# Patient Record
Sex: Male | Born: 1955 | Race: White | Hispanic: No | Smoking: Current every day smoker
Health system: Southern US, Community
[De-identification: ages and names within clinical notes are randomized; demographics above are authoritative.]

## PROBLEM LIST (undated history)

## (undated) DIAGNOSIS — E785 Hyperlipidemia, unspecified: Secondary | ICD-10-CM

## (undated) DIAGNOSIS — I10 Essential (primary) hypertension: Secondary | ICD-10-CM

## (undated) DIAGNOSIS — M1990 Unspecified osteoarthritis, unspecified site: Secondary | ICD-10-CM

## (undated) HISTORY — PX: COLONOSCOPY: SHX174

## (undated) HISTORY — DX: Hyperlipidemia, unspecified: E78.5

## (undated) HISTORY — PX: TONSILLECTOMY: SUR1361

## (undated) HISTORY — DX: Essential (primary) hypertension: I10

## (undated) HISTORY — PX: APPENDECTOMY: SHX54

---

## 2014-01-23 LAB — LIPID PANEL
Cholesterol: 225 — AB (ref 0–200)
HDL: 49 (ref 35–70)
LDL Cholesterol: 145
Triglycerides: 156 (ref 40–160)

## 2014-01-23 LAB — BASIC METABOLIC PANEL
BUN: 15 (ref 4–21)
Creatinine: 1.3 (ref 0.6–1.3)
GLUCOSE: 104
POTASSIUM: 4.8 (ref 3.4–5.3)
SODIUM: 140 (ref 137–147)

## 2014-01-23 LAB — HEPATIC FUNCTION PANEL
ALT: 46 — AB (ref 10–40)
AST: 31 (ref 14–40)
BILIRUBIN, TOTAL: 0.9

## 2014-01-23 LAB — HEMOGLOBIN A1C: HEMOGLOBIN A1C: 5.7

## 2014-06-19 ENCOUNTER — Other Ambulatory Visit: Payer: Self-pay | Admitting: Orthopedic Surgery

## 2014-06-21 ENCOUNTER — Encounter (HOSPITAL_BASED_OUTPATIENT_CLINIC_OR_DEPARTMENT_OTHER): Payer: Self-pay | Admitting: *Deleted

## 2014-06-21 NOTE — Progress Notes (Signed)
No labs needed

## 2014-06-26 NOTE — H&P (Signed)
Patrick Cannon is an 58 y.o. male.    Chief Complaint: Left Second Toe Pain  HPI:  Patient presents with a chief complaint of left second toe mass/cyst.  Patient states that he noticed the cyst former approximately 4 months ago.  He denies any injury.  He denies any fevers chills night sweats or other signs of infection.  Today, he states his pain is mild at most.  He has noticed swelling over the toe but denies any numbness tingling or weakness.  His symptoms do not wake him from sleep.  Nothing seems to make it better or worse.  Patient did attempt to drain the cyst on his own with a sterile needle.  The patient states the cyst came back over the course of several days.  Past Medical History  Diagnosis Date  . Arthritis     Past Surgical History  Procedure Laterality Date  . Colonoscopy    . Appendectomy      age 31  . Tonsillectomy      age 89    History reviewed. No pertinent family history. Social History:  reports that he has been smoking.  He does not have any smokeless tobacco history on file. He reports that he drinks alcohol. He reports that he does not use illicit drugs.  Allergies: No Known Allergies  No prescriptions prior to admission    No results found for this or any previous visit (from the past 48 hour(s)). No results found.  Review of Systems  Constitutional: Negative.   HENT: Negative.   Eyes: Negative.   Respiratory: Negative.   Cardiovascular: Negative.   Gastrointestinal: Negative.   Genitourinary: Negative.   Musculoskeletal: Positive for joint pain.  Skin: Negative.   Neurological: Negative.   Endo/Heme/Allergies: Negative.   Psychiatric/Behavioral: Negative.     Height 5\' 9"  (1.753 m), weight 95.255 kg (210 lb). Physical Exam  Constitutional: He is oriented to person, place, and time. He appears well-developed and well-nourished.  HENT:  Head: Normocephalic and atraumatic.  Eyes: Pupils are equal, round, and reactive to light.  Neck: Normal  range of motion. Neck supple.  Cardiovascular: Normal rate and regular rhythm.   Respiratory: Effort normal.  Musculoskeletal: He exhibits tenderness.  Today, the patient's left foot has good strength good range of motion.  Patient does have an obvious cystic mass over the DIP joint of the second toe.  No erythema or warmth.  Patient is neurovascularly intact distally.  His calves are soft and nontender.  Neurological: He is alert and oriented to person, place, and time.  Skin: Skin is warm and dry.  Psychiatric: He has a normal mood and affect. His behavior is normal. Judgment and thought content normal.     Assessment/Plan Assess: Left second toe mucous cyst  Plan: The patient is discussed with Dr. Mayer Camel who also examined the patient's foot.  Patient wishes to proceed with surgical intervention.  He is made aware the benefits risks and potential complications of surgery.  A posting slip is completed and the patient is to discuss scheduling with Sandi Raveling.  Patient is told that we can do this under straight local.  He is to call with any issues.  We will see the patient in the time of surgical intervention and then approximately 10 days postop for his first postop visit.  Patrick Cannon R 06/26/2014, 8:28 AM

## 2014-06-27 ENCOUNTER — Ambulatory Visit (HOSPITAL_BASED_OUTPATIENT_CLINIC_OR_DEPARTMENT_OTHER)
Admission: RE | Admit: 2014-06-27 | Payer: BC Managed Care – PPO | Source: Ambulatory Visit | Admitting: Orthopedic Surgery

## 2014-06-27 HISTORY — DX: Unspecified osteoarthritis, unspecified site: M19.90

## 2014-06-27 SURGERY — EXCISION MASS
Anesthesia: Choice | Laterality: Left

## 2014-08-01 LAB — BASIC METABOLIC PANEL
BUN: 15 (ref 4–21)
CREATININE: 1.3 (ref 0.6–1.3)
Glucose: 104
Potassium: 4.8 (ref 3.4–5.3)
Sodium: 140 (ref 137–147)

## 2014-08-01 LAB — LIPID PANEL
CHOLESTEROL: 239 — AB (ref 0–200)
HDL: 47 (ref 35–70)
LDL Cholesterol: 154
TRIGLYCERIDES: 190 — AB (ref 40–160)

## 2014-08-01 LAB — HEPATIC FUNCTION PANEL
ALT: 46 — AB (ref 10–40)
AST: 31 (ref 14–40)
Bilirubin, Total: 0.9

## 2014-08-01 LAB — HEMOGLOBIN A1C: Hemoglobin A1C: 5.7

## 2014-11-30 LAB — LIPID PANEL
Cholesterol: 179 (ref 0–200)
HDL: 50 (ref 35–70)
LDL Cholesterol: 92
Triglycerides: 184 — AB (ref 40–160)

## 2014-11-30 LAB — BASIC METABOLIC PANEL
BUN: 15 (ref 4–21)
CREATININE: 1.2 (ref 0.6–1.3)
GLUCOSE: 103
Potassium: 5.1 (ref 3.4–5.3)
Sodium: 138 (ref 137–147)

## 2014-11-30 LAB — HEPATIC FUNCTION PANEL
ALT: 49 — AB (ref 10–40)
AST: 34 (ref 14–40)
BILIRUBIN, TOTAL: 0.8

## 2014-11-30 LAB — HEMOGLOBIN A1C: HEMOGLOBIN A1C: 5.7

## 2015-05-15 LAB — HEPATIC FUNCTION PANEL
ALT: 37 (ref 10–40)
AST: 29 (ref 14–40)
Bilirubin, Total: 1

## 2015-05-15 LAB — BASIC METABOLIC PANEL
BUN: 17 (ref 4–21)
Creatinine: 1.1 (ref 0.6–1.3)
Glucose: 94
POTASSIUM: 5 (ref 3.4–5.3)
SODIUM: 141 (ref 137–147)

## 2015-05-15 LAB — HEMOGLOBIN A1C: HEMOGLOBIN A1C: 5.6

## 2015-05-15 LAB — LIPID PANEL
Cholesterol: 182 (ref 0–200)
HDL: 55 (ref 35–70)
LDL Cholesterol: 96
TRIGLYCERIDES: 158 (ref 40–160)

## 2015-12-16 LAB — BASIC METABOLIC PANEL
BUN: 16 (ref 4–21)
CREATININE: 1.2 (ref 0.6–1.3)
Glucose: 103
Potassium: 4.8 (ref 3.4–5.3)
Sodium: 142 (ref 137–147)

## 2015-12-16 LAB — HEMOGLOBIN A1C: Hemoglobin A1C: 5.7

## 2015-12-16 LAB — LIPID PANEL
CHOLESTEROL: 154 (ref 0–200)
HDL: 52 (ref 35–70)
LDL Cholesterol: 81
TRIGLYCERIDES: 106 (ref 40–160)

## 2015-12-16 LAB — HEPATIC FUNCTION PANEL
ALT: 48 — AB (ref 10–40)
AST: 28 (ref 14–40)
Bilirubin, Total: 0.8

## 2016-06-15 LAB — LIPID PANEL
Cholesterol: 206 — AB (ref 0–200)
HDL: 47 (ref 35–70)
LDL CALC: 125
Triglycerides: 169 — AB (ref 40–160)

## 2016-06-15 LAB — BASIC METABOLIC PANEL
BUN: 17 (ref 4–21)
Creatinine: 1.2 (ref 0.6–1.3)
GLUCOSE: 107
Potassium: 4.8 (ref 3.4–5.3)
SODIUM: 139 (ref 137–147)

## 2016-06-15 LAB — HEPATIC FUNCTION PANEL
ALT: 36 (ref 10–40)
AST: 26 (ref 14–40)
BILIRUBIN, TOTAL: 0.8

## 2016-06-15 LAB — HEMOGLOBIN A1C: Hemoglobin A1C: 5.9

## 2016-07-14 ENCOUNTER — Encounter: Payer: Managed Care, Other (non HMO) | Attending: Family Medicine | Admitting: Dietician

## 2016-07-14 DIAGNOSIS — Z713 Dietary counseling and surveillance: Secondary | ICD-10-CM | POA: Insufficient documentation

## 2016-07-14 DIAGNOSIS — Z6833 Body mass index (BMI) 33.0-33.9, adult: Secondary | ICD-10-CM | POA: Insufficient documentation

## 2016-07-14 DIAGNOSIS — R7303 Prediabetes: Secondary | ICD-10-CM | POA: Insufficient documentation

## 2016-07-14 DIAGNOSIS — E669 Obesity, unspecified: Secondary | ICD-10-CM | POA: Diagnosis not present

## 2016-07-14 NOTE — Patient Instructions (Signed)
Check protein shake (try to keep carbs under 20-25 gram per serving).  If you are hungry, have a snack with carbs and protein (see list). Aim to fill half of your plate with vegetables. Have protein (size of the palm of your hand 4-5 oz). Have carbs about 1 cup portion at meals. Try non fat Greek yogurt instead of sour cream.  For a sweet a night, continue to have 1 square of dark chocolate (instead of cookies or ice cream sandwich).

## 2016-07-14 NOTE — Progress Notes (Signed)
  Medical Nutrition Therapy:  Appt start time: 0945 end time:  1035.   Assessment:  Primary concerns today: Patrick Cannon is here today since his doctor recommended that he talk to a dietitian about his diet. Would like to eat healthy and also have food that tastes good. Has prediabetes but not sure what the number is but states that it has been the same for a while. Would like to lose about 20 lbs. Weight has been stable for the past 4-5 years. Has been working out his whole life.  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 Lives with his son temporarily. Having son home is causing him to eat larger portion but it is also causing him to eat more meals at home. States that he does the food shopping and meal preparation at home. Does not usually miss or skip meals. Eats out 3-4 x week.   Has been working on cutting back on portions recently. Used to have sandwich with cookies, chips and milk for lunch (now has leftover dinner).  Feels like the problem is with his portions.   Sleeping about 5-6 hours per night and waking up a lot, mostly due to hip pain. Doesn't like taking medication to sleep.   Preferred Learning Style:   No preference indicated   Learning Readiness:   Ready  MEDICATIONS: none   DIETARY INTAKE:  Usual eating pattern includes 3 meals and 1 snacks per day.  Avoided foods include artichokes, brussels sprouts, organ meat    24-hr recall: Works out  B ( AM): Protein shake with berries   Snk ( AM): none or fruit  L ( PM): leftovers - grilled chicken or cod or pot roast with potatoes or rice Snk ( PM): none or fruit D ( PM): grilled chicken or cod or pot roast with potatoes or rice with vegetable/salad Snk ( PM): sweets 3 x week (cookies or ice cream sandwich) Beverages: black coffee or water  Usual physical activity: works out 5 x week 3 days cardio and weight and 2 x week just cardio  Estimated energy needs: 2000 calories 225 g carbohydrates 150 g  protein 56 g fat  Progress Towards Goal(s):  In progress.   Nutritional Diagnosis:  Shepherd-3.3 Overweight/obesity As related to hx of large portion sizes.  As evidenced by BMI of 33.1 and prediabetes.    Intervention:  Nutrition counseling provided. Plan: Check protein shake (try to keep carbs under 20-25 gram per serving).  If you are hungry, have a snack with carbs and protein (see list). Aim to fill half of your plate with vegetables. Have protein (size of the palm of your hand 4-5 oz). Have carbs about 1 cup portion at meals. Try non fat Greek yogurt instead of sour cream.  For a sweet a night, continue to have 1 square of dark chocolate (instead of cookies or ice cream sandwich).   Teaching Method Utilized:  Visual Auditory Hands on  Handouts given during visit include:  MyPlate Handout  15 g CHO Snacks  Meal card  Barriers to learning/adherence to lifestyle change: none  Demonstrated degree of understanding via:  Teach Back   Monitoring/Evaluation:  Dietary intake, exercise, and body weight prn.

## 2016-07-16 ENCOUNTER — Encounter: Payer: Self-pay | Admitting: Dietician

## 2017-05-31 ENCOUNTER — Encounter: Payer: Self-pay | Admitting: Family Medicine

## 2017-05-31 ENCOUNTER — Ambulatory Visit (INDEPENDENT_AMBULATORY_CARE_PROVIDER_SITE_OTHER): Payer: Managed Care, Other (non HMO) | Admitting: Family Medicine

## 2017-05-31 VITALS — BP 126/80 | HR 87 | Resp 12 | Ht 69.0 in | Wt 227.4 lb

## 2017-05-31 DIAGNOSIS — M159 Polyosteoarthritis, unspecified: Secondary | ICD-10-CM | POA: Insufficient documentation

## 2017-05-31 DIAGNOSIS — E669 Obesity, unspecified: Secondary | ICD-10-CM | POA: Insufficient documentation

## 2017-05-31 DIAGNOSIS — F17200 Nicotine dependence, unspecified, uncomplicated: Secondary | ICD-10-CM | POA: Diagnosis not present

## 2017-05-31 DIAGNOSIS — I10 Essential (primary) hypertension: Secondary | ICD-10-CM | POA: Insufficient documentation

## 2017-05-31 DIAGNOSIS — Z6833 Body mass index (BMI) 33.0-33.9, adult: Secondary | ICD-10-CM | POA: Diagnosis not present

## 2017-05-31 MED ORDER — NICOTINE 14 MG/24HR TD PT24: 14.0000 mg | MEDICATED_PATCH | Freq: Every day | TRANSDERMAL | 1 refills | Status: DC

## 2017-05-31 NOTE — Patient Instructions (Signed)
A few things to remember from today's visit:   Hypertension, essential, benign  Generalized osteoarthritis of hand  Tobacco use disorder - Plan: nicotine (NICODERM CQ - DOSED IN MG/24 HOURS) 14 mg/24hr patch  Blood pressure goal for most people is less than 140/90.   Most recent cardiologists' recommendations recommend blood pressure at or less than 130/80.   Elevated blood pressure increases the risk of strokes, heart and kidney disease, and eye problems. Regular physical activity and a healthy diet (DASH diet) usually help. Low salt diet. DASH diet recommended: high in vegetables, fruits, low-fat dairy products, whole grains, poultry, fish, and nuts; and low in sweets, sugar-sweetened beverages, and red meats.    Caution with some over the counter medications as cold medications, dietary products (for weight loss), and Ibuprofen or Aleve (frequent use);all these medications could cause elevation of blood pressure.    Please be sure medication list is accurate. If a new problem present, please set up appointment sooner than planned today.

## 2017-05-31 NOTE — Progress Notes (Signed)
HPI:   Patrick Cannon is a 61 y.o. male, who is here today to establish care.  Former PCP: Dr Derenda Fennel at [facility], [place] Last preventive routine visit: 06/2016.  Chronic medical problems: HTN,prediabetes,HLD, and tobacco use disorder among some.  HTN, he did not start Amlodipine 5 mg , which was recommended by his former PCP 06/2016. He has checked BP's at home and most are < 140/90, a few in the low 140's/90's. He does not follow a low salt diet consistently.  He exercises regularly.   Concerns today: Joint pain. Achy, intermittent pain on some IP joints bilateral and right hip. He has had pain for a while, exacerbated by some activities as typing (hands/IP), prolonged walking or standing. Alleviated by rest. Pain is mild, he usually do not take medication for pain. No limitation of ROM, erythema,or edema. Hyperuricemia in 12/2014, he was on Allopurinol 300 mg until 12/2015.  Tobacco use disorder: He has tried to quit in the past, took Chantix. He has not tried stopping tobacco use lately, 5-10 cig daily, he is aware of adverse effects. Father dies from lung cancer, he was also a smoker.   He lives alone.    Review of Systems  Constitutional: Negative for activity change, appetite change, fatigue, fever and unexpected weight change.  HENT: Negative for mouth sores, nosebleeds, sore throat and trouble swallowing.   Eyes: Negative for redness and visual disturbance.  Respiratory: Negative for cough, shortness of breath and wheezing.   Cardiovascular: Negative for chest pain, palpitations and leg swelling.  Gastrointestinal: Negative for abdominal pain, nausea and vomiting.  Genitourinary: Negative for decreased urine volume, dysuria and hematuria.  Musculoskeletal: Positive for arthralgias. Negative for gait problem and joint swelling.  Skin: Negative for rash.  Neurological: Negative for syncope, weakness, numbness and headaches.  Psychiatric/Behavioral: Negative  for confusion. The patient is not nervous/anxious.     No current outpatient prescriptions on file prior to visit.   No current facility-administered medications on file prior to visit.      Past Medical History:  Diagnosis Date  . Arthritis   . Hyperlipidemia   . Hypertension    No Known Allergies  Family History  Problem Relation Age of Onset  . Cancer Mother        Glyoblastoma  . Cancer Father        Lung  . Diabetes Neg Hx   . Hyperlipidemia Neg Hx   . Hypertension Neg Hx     Social History   Social History  . Marital status: Divorced    Spouse name: N/A  . Number of children: N/A  . Years of education: N/A   Social History Main Topics  . Smoking status: Current Every Day Smoker    Packs/day: 0.50  . Smokeless tobacco: Never Used  . Alcohol use Yes     Comment: almost daily  . Drug use: No  . Sexual activity: Not Asked   Other Topics Concern  . None   Social History Narrative  . None    Vitals:   05/31/17 1452  BP: 126/80  Pulse: 87  Resp: 12   O2 sat at RA 98% Body mass index is 33.58 kg/m.   Physical Exam  Nursing note and vitals reviewed. Constitutional: He is oriented to person, place, and time. He appears well-developed. No distress.  HENT:  Head: Atraumatic.  Mouth/Throat: Oropharynx is clear and moist and mucous membranes are normal.  Eyes: Pupils are equal,  round, and reactive to light. Conjunctivae and EOM are normal.  Neck: No tracheal deviation present. No thyroid mass and no thyromegaly present.  Cardiovascular: Normal rate and regular rhythm.   No murmur heard. Pulses:      Dorsalis pedis pulses are 2+ on the right side, and 2+ on the left side.  Respiratory: Effort normal and breath sounds normal. No respiratory distress.  GI: Soft. He exhibits no mass. There is no hepatomegaly. There is no tenderness.  Musculoskeletal: He exhibits no edema.  A few IP joints bilateral with nodules (Heberden's nodes), mild flexed  deformity DIP right 5th finger. No signs of synovitis and no major limitation of ROM.  Lymphadenopathy:    He has no cervical adenopathy.  Neurological: He is alert and oriented to person, place, and time. He has normal strength.  Skin: Skin is warm. No erythema.  Psychiatric: He has a normal mood and affect. Cognition and memory are normal.  Well groomed, good eye contact.    ASSESSMENT AND PLAN:   [name] was seen today for establish care.  Diagnoses and all orders for this visit:  Generalized osteoarthritis of hand  Dx discussed as well as prognosis and treatment options. OTC Tumeric, fish oil, and OTC NSAIDs may help with symptoms.  Hypertension, essential, benign  Adequate controlled on non pharmacologic management. Some home BP readings mildly elevated. DASH/low salt diet recommended. Some possible complications of poorly controlled HTN discussed.  Tobacco use disorder  After reviewing adverse effects and treatment options, he agrees with trying Nicotine patches. Some side effects discussed.  -     nicotine (NICODERM CQ - DOSED IN MG/24 HOURS) 14 mg/24hr patch; Place 1 patch (14 mg total) onto the skin daily.  Class 1 obesity without serious comorbidity with body mass index (BMI) of 33.0 to 33.9 in adult, unspecified obesity type  We discussed benefits of wt loss as well as adverse effects of obesity. Consistency with healthy diet and physical activity recommended.    Sharlee Rufino G. Martinique, MD  [facility]. [place] office.

## 2017-06-01 ENCOUNTER — Encounter: Payer: Self-pay | Admitting: Family Medicine

## 2017-09-27 DIAGNOSIS — E785 Hyperlipidemia, unspecified: Secondary | ICD-10-CM | POA: Insufficient documentation

## 2017-09-27 NOTE — Progress Notes (Signed)
HPI:  Patrick Cannon is a 61 y.o.male here today for his routine physical examination.  Last CPE: 12/2013 He lives alone, he has a "very social life."  Regular exercise 3 or more times per week: Not as much as he did months ago due to cold weather. Following a healthy diet: No for the past 3 months, his kitchen is being renovated.   Chronic medical problems: Hypertension, HLD,osteoarthritis, hyperuricemia, and tobacco use almost some.  Hx of STD's: Denies. He is not sexually active. HIV screening: Never, he would like to have lab done.    Tdap in 06/2011. Last eye exam 04/2017,per pt report.  -Hep C screening: He has not had it and would like to do it today.   Last colon cancer screening: Colonoscopy 02/17/2017. Last prostate ca screening: PSA 12/2013 was 2.8.  Occasional nocturia, depending on fluid intake.  Sometimes urine dribbling. No gross hematuria.  -Denies high alcohol intake or Hx of illicit drug use. Smoker, he is using a nicotine patch, not helping.   -Concerns and/or follow up today:   HLD: He is on non pharmacologic treatment.  Lab Results  Component Value Date   CHOL 206 (A) 06/15/2016   HDL 47 06/15/2016   LDLCALC 125 06/15/2016   TRIG 169 (A) 06/15/2016    Tobacco use disorder: Currently he is on nicotine patch, he does not think it is helping and frequently he forgets to change patch. He has taken Chantix in the past and it helped. He has smoked since age 24, quit 10 years, and average he has smoked a half PPD.    Review of Systems  Constitutional: Negative for activity change, appetite change, fatigue, fever and unexpected weight change.  HENT: Negative for dental problem, mouth sores, nosebleeds, sore throat, trouble swallowing and voice change.   Eyes: Negative for redness and visual disturbance.  Respiratory: Negative for cough, shortness of breath and wheezing.   Cardiovascular: Negative for chest pain, palpitations and leg  swelling.  Gastrointestinal: Negative for abdominal pain, blood in stool, nausea and vomiting.  Endocrine: Negative for cold intolerance, heat intolerance, polydipsia, polyphagia and polyuria.  Genitourinary: Negative for decreased urine volume, dysuria, genital sores, hematuria and testicular pain.  Musculoskeletal: Positive for arthralgias and back pain. Negative for joint swelling and myalgias.  Skin: Negative for color change and rash.  Neurological: Negative for dizziness, syncope, weakness, numbness and headaches.  Hematological: Negative for adenopathy. Does not bruise/bleed easily.  Psychiatric/Behavioral: Negative for confusion and sleep disturbance. The patient is not nervous/anxious.   All other systems reviewed and are negative.    Current Outpatient Medications on File Prior to Visit  Medication Sig Dispense Refill  . Bioflavonoid Products (GRAPE SEED PO) Take 60 mg by mouth.    Marland Kitchen GARLIC PO Take 761 mg by mouth.    . Misc Natural Products (TART CHERRY ADVANCED PO) Take 1,500 mg by mouth.    . Multiple Vitamin (MULTIVITAMIN) tablet Take 1 tablet by mouth daily.    . Omega-3 Fatty Acids (OMEGA-3 FISH OIL PO) Take 600 mg by mouth.    . Red Yeast Rice Extract (RED YEAST RICE PO) Take 1,200 mg by mouth.     No current facility-administered medications on file prior to visit.      Past Medical History:  Diagnosis Date  . Arthritis   . Hyperlipidemia   . Hypertension     Past Surgical History:  Procedure Laterality Date  . APPENDECTOMY  age 62  . COLONOSCOPY    . TONSILLECTOMY     age 52    No Known Allergies  Family History  Problem Relation Age of Onset  . Cancer Mother        Glyoblastoma  . Cancer Father        Lung  . Diabetes Neg Hx   . Hyperlipidemia Neg Hx   . Hypertension Neg Hx     Social History   Socioeconomic History  . Marital status: Divorced    Spouse name: None  . Number of children: None  . Years of education: None  . Highest  education level: None  Social Needs  . Financial resource strain: None  . Food insecurity - worry: None  . Food insecurity - inability: None  . Transportation needs - medical: None  . Transportation needs - non-medical: None  Occupational History  . None  Tobacco Use  . Smoking status: Current Every Day Smoker    Packs/day: 0.50  . Smokeless tobacco: Never Used  Substance and Sexual Activity  . Alcohol use: Yes    Comment: almost daily  . Drug use: No  . Sexual activity: None  Other Topics Concern  . None  Social History Narrative  . None     Vitals:   09/28/17 0744  BP: 126/78  Pulse: 77  Resp: 12  Temp: 98.3 F (36.8 C)  SpO2: 96%   Body mass index is 33.39 kg/m.   Wt Readings from Last 3 Encounters:  09/28/17 226 lb 2 oz (102.6 kg)  05/31/17 227 lb 6 oz (103.1 kg)  07/14/16 227 lb 3.2 oz (103.1 kg)    Physical Exam  Nursing note and vitals reviewed. Constitutional: He is oriented to person, place, and time. He appears well-developed. No distress.  HENT:  Head: Normocephalic and atraumatic.  Right Ear: Hearing, tympanic membrane, external ear and ear canal normal.  Left Ear: Hearing, tympanic membrane, external ear and ear canal normal.  Mouth/Throat: Oropharynx is clear and moist and mucous membranes are normal.  Eyes: Conjunctivae and EOM are normal. Pupils are equal, round, and reactive to light.  Neck: Normal range of motion. No tracheal deviation present. No thyromegaly present.  Cardiovascular: Normal rate and regular rhythm.  No murmur heard. Pulses:      Dorsalis pedis pulses are 2+ on the right side, and 2+ on the left side.  Respiratory: Effort normal and breath sounds normal. No respiratory distress.  GI: Soft. He exhibits no mass. There is no tenderness.  Genitourinary:  Genitourinary Comments: Refused,no concerns.  Musculoskeletal: He exhibits no edema or tenderness.  No signs of synovitis.  Lymphadenopathy:    He has no cervical  adenopathy.       Right: No supraclavicular adenopathy present.       Left: No supraclavicular adenopathy present.  Neurological: He is alert and oriented to person, place, and time. He has normal strength. No cranial nerve deficit or sensory deficit. Coordination and gait normal.  Reflex Scores:      Bicep reflexes are 2+ on the right side and 2+ on the left side.      Patellar reflexes are 2+ on the right side and 2+ on the left side. Skin: Skin is warm. No rash noted. No erythema.  Psychiatric: He has a normal mood and affect.  Well groomed, good eye contact.     ASSESSMENT AND PLAN:   Mr. Stanton was seen today for annual exam.  Diagnoses and all  orders for this visit:  Lab Results  Component Value Date   CHOL 214 (H) 09/28/2017   HDL 52.10 09/28/2017   LDLCALC 138 (H) 09/28/2017   TRIG 117.0 09/28/2017   CHOLHDL 4 09/28/2017   Lab Results  Component Value Date   CREATININE 1.06 09/28/2017   BUN 15 09/28/2017   NA 140 09/28/2017   K 4.3 09/28/2017   CL 106 09/28/2017   CO2 25 09/28/2017   Lab Results  Component Value Date   ALT 27 09/28/2017   AST 20 09/28/2017   ALKPHOS 52 09/28/2017   BILITOT 0.7 09/28/2017   Lab Results  Component Value Date   LABURIC 7.4 09/28/2017    Routine general medical examination at a health care facility  We discussed the importance of regular physical activity and healthy diet for prevention of chronic illness and/or complications. Preventive guidelines reviewed. Vaccination updated, Rx for Shingrix given. Aspirin for primary prevention discussed,not recommended for now.  Next CPE in a year.  The 10-year ASCVD risk score Mikey Bussing DC Brooke Bonito., et al., 2013) is: 15.1%   Values used to calculate the score:     Age: 49 years     Sex: Male     Is Non-Hispanic African American: No     Diabetic: No     Tobacco smoker: Yes     Systolic Blood Pressure: 301 mmHg     Is BP treated: No     HDL Cholesterol: 52.1 mg/dL     Total  Cholesterol: 214 mg/dL  Hyperlipidemia, unspecified hyperlipidemia type  Continue nonpharmacologic treatment for now. Further recommendations will be given according to lab results and 10 years cardiovascular risk score. Follow-up in 6-12 months.  -     Lipid panel  Diabetes mellitus screening -     Comprehensive metabolic panel -     Hemoglobin A1c  Prostate cancer screening -     PSA(Must document that pt has been informed of limitations of PSA testing.)  Encounter for HCV screening test for high risk patient -     Hepatitis C antibody screen  Nocturia -     Urinalysis, Routine w reflex microscopic  Hyperuricemia  Continue low purine diet. Currently he is asymptomatic. Further recommendation will be given according to lab results.  -     Uric acid  Encounter for screening for HIV -     HIV antibody  Tobacco use disorder  And evidence of smoking cessation discussed as well adverse effects of tobacco.   He has tried Chantix before and has helped. Nicotine patch is not helping, so he will discontinue. He will try lower dose Chantix for 3-4 months. Instructed to let me know about 50 minutes with the smoking cessation.  -     varenicline (CHANTIX) 0.5 MG tablet; Take 1 tablet (0.5 mg total) by mouth 2 (two) times daily.   Hypertension, essential, benign  Adequately controlled. Continue low-salt DASH diet. Eye exam is current. Continue nonpharmacologic treatment. I think it is appropriate to follow annually as well as he monitors BP at home periodically.  Other orders -     Zoster Vaccine Adjuvanted [facility]) injection; 0.5 ml in muscle and repeat in 8 weeks    Return in 1 year (on 09/28/2018).    Trygve Thal G. Martinique, MD  [facility]. [place] office.

## 2017-09-28 ENCOUNTER — Ambulatory Visit (INDEPENDENT_AMBULATORY_CARE_PROVIDER_SITE_OTHER): Payer: 59 | Admitting: Family Medicine

## 2017-09-28 ENCOUNTER — Encounter: Payer: Self-pay | Admitting: Family Medicine

## 2017-09-28 VITALS — BP 126/78 | HR 77 | Temp 98.3°F | Resp 12 | Ht 69.0 in | Wt 226.1 lb

## 2017-09-28 DIAGNOSIS — Z1159 Encounter for screening for other viral diseases: Secondary | ICD-10-CM

## 2017-09-28 DIAGNOSIS — Z9189 Other specified personal risk factors, not elsewhere classified: Secondary | ICD-10-CM

## 2017-09-28 DIAGNOSIS — E790 Hyperuricemia without signs of inflammatory arthritis and tophaceous disease: Secondary | ICD-10-CM | POA: Diagnosis not present

## 2017-09-28 DIAGNOSIS — Z23 Encounter for immunization: Secondary | ICD-10-CM

## 2017-09-28 DIAGNOSIS — I10 Essential (primary) hypertension: Secondary | ICD-10-CM

## 2017-09-28 DIAGNOSIS — R351 Nocturia: Secondary | ICD-10-CM | POA: Diagnosis not present

## 2017-09-28 DIAGNOSIS — F17200 Nicotine dependence, unspecified, uncomplicated: Secondary | ICD-10-CM | POA: Diagnosis not present

## 2017-09-28 DIAGNOSIS — Z125 Encounter for screening for malignant neoplasm of prostate: Secondary | ICD-10-CM | POA: Diagnosis not present

## 2017-09-28 DIAGNOSIS — Z114 Encounter for screening for human immunodeficiency virus [HIV]: Secondary | ICD-10-CM

## 2017-09-28 DIAGNOSIS — Z131 Encounter for screening for diabetes mellitus: Secondary | ICD-10-CM | POA: Diagnosis not present

## 2017-09-28 DIAGNOSIS — E785 Hyperlipidemia, unspecified: Secondary | ICD-10-CM

## 2017-09-28 DIAGNOSIS — Z0000 Encounter for general adult medical examination without abnormal findings: Secondary | ICD-10-CM

## 2017-09-28 LAB — URINALYSIS, ROUTINE W REFLEX MICROSCOPIC
Bilirubin Urine: NEGATIVE
Hgb urine dipstick: NEGATIVE
KETONES UR: NEGATIVE
Leukocytes, UA: NEGATIVE
Nitrite: NEGATIVE
RBC / HPF: NONE SEEN (ref 0–?)
Specific Gravity, Urine: >=1.03 — AB (ref 1.000–1.030)
Total Protein, Urine: NEGATIVE
UROBILINOGEN UA: 0.2 (ref 0.0–1.0)
Urine Glucose: NEGATIVE
pH: 5.5 (ref 5.0–8.0)

## 2017-09-28 LAB — LIPID PANEL
CHOL/HDL RATIO: 4
Cholesterol: 214 mg/dL — ABNORMAL HIGH (ref 0–200)
HDL: 52.1 mg/dL (ref >39.00)
LDL Cholesterol: 138 mg/dL — ABNORMAL HIGH (ref 0–99)
NonHDL: 161.84
Triglycerides: 117 mg/dL (ref 0.0–149.0)
VLDL: 23.4 mg/dL (ref 0.0–40.0)

## 2017-09-28 LAB — COMPREHENSIVE METABOLIC PANEL
ALT: 27 U/L (ref 0–53)
AST: 20 U/L (ref 0–37)
Albumin: 4.2 g/dL (ref 3.5–5.2)
Alkaline Phosphatase: 52 U/L (ref 39–117)
BUN: 15 mg/dL (ref 6–23)
CO2: 25 meq/L (ref 19–32)
Calcium: 9.6 mg/dL (ref 8.4–10.5)
Chloride: 106 mEq/L (ref 96–112)
Creatinine, Ser: 1.06 mg/dL (ref 0.40–1.50)
GFR: 75.14 mL/min (ref >60.00)
GLUCOSE: 115 mg/dL — AB (ref 70–99)
Potassium: 4.3 mEq/L (ref 3.5–5.1)
Sodium: 140 mEq/L (ref 135–145)
Total Bilirubin: 0.7 mg/dL (ref 0.2–1.2)
Total Protein: 7 g/dL (ref 6.0–8.3)

## 2017-09-28 LAB — URIC ACID: Uric Acid, Serum: 7.4 mg/dL (ref 4.0–7.8)

## 2017-09-28 LAB — HEMOGLOBIN A1C: Hgb A1c MFr Bld: 6 % (ref 4.6–6.5)

## 2017-09-28 LAB — PSA: PSA: 1.8 ng/mL (ref 0.10–4.00)

## 2017-09-28 MED ORDER — ZOSTER VAC RECOMB ADJUVANTED 50 MCG/0.5ML IM SUSR: INTRAMUSCULAR | 1 refills | Status: DC

## 2017-09-28 MED ORDER — VARENICLINE TARTRATE 0.5 MG PO TABS: 0.5000 mg | ORAL_TABLET | Freq: Two times a day (BID) | ORAL | 3 refills | Status: DC

## 2017-09-28 NOTE — Patient Instructions (Addendum)
A few things to remember from today's visit:   Routine general medical examination at a health care facility  Hyperlipidemia, unspecified hyperlipidemia type  Diabetes mellitus screening - Plan: Comprehensive metabolic panel, Hemoglobin A1c  Prostate cancer screening - Plan: PSA(Must document that pt has been informed of limitations of PSA testing.)  Encounter for HCV screening test for high risk patient - Plan: Hepatitis C antibody screen  Nocturia - Plan: Urinalysis, Routine w reflex microscopic  Hyperuricemia - Plan: Uric acid  Encounter for screening for HIV - Plan: HIV antibody   At least 150 minutes of moderate exercise per week, daily brisk walking for 15-30 min is a good exercise option. Healthy diet low in saturated (animal) fats and sweets and consisting of fresh fruits and vegetables, lean meats such as fish and white chicken and whole grains.  - Vaccines:  Tdap vaccine every 10 years.  06/2011, so due in 2022.  Shingles vaccine recommended at age 24, could be given after 62 years of age but not sure about insurance coverage. Prescription given.   Pneumonia vaccines:  Prevnar 74 at 62 and Pneumovax at 17.   -Screening recommendations for low/normal risk males:  Screening for diabetes at age 25 and every 3 years. Earlier screening if cardiovascular risk factors.    Colon cancer screening at age 72 and until age 55.  Prostate cancer screening: some controversy, starts usually at 78: Rectal exam and PSA.  Aortic Abdominal Aneurism once between 70 and 70 years old if ever smoker.  Also recommended:  1. Dental visit- Brush and floss your teeth twice daily; visit your dentist twice a year. 2. Eye doctor- Get an eye exam at least every 2 years. 3. Helmet use- Always wear a helmet when riding a bicycle, motorcycle, rollerblading or skateboarding. 4. Safe sex- If you may be exposed to sexually transmitted infections, use a condom. 5. Seat belts- Seat belts can  save your live; always wear one. 6. Smoke/Carbon Monoxide detectors- These detectors need to be installed on the appropriate level of your home. Replace batteries at least once a year. 7. Skin cancer- When out in the sun please cover up and use sunscreen 15 SPF or higher. 8. Violence- If anyone is threatening or hurting you, please tell your healthcare provider.  9. Drink alcohol in moderation- Limit alcohol intake to one drink or less per day. Never drink and drive.  Please be sure medication list is accurate. If a new problem present, please set up appointment sooner than planned today.

## 2017-09-29 LAB — HIV ANTIBODY (ROUTINE TESTING W REFLEX): HIV 1&2 Ab, 4th Generation: NONREACTIVE

## 2017-09-29 LAB — HEPATITIS C ANTIBODY
Hepatitis C Ab: NONREACTIVE
SIGNAL TO CUT-OFF: 0.02 (ref <1.00)

## 2017-10-04 ENCOUNTER — Telehealth: Payer: Self-pay | Admitting: Family Medicine

## 2017-10-04 NOTE — Telephone Encounter (Signed)
Results given. Pt states he would like to start Atorvastatin    Notes recorded by Martinique, Betty G, MD on 10/03/2017 at 8:45 PM EST Lab work done recently is back:  -Renal function normal. -HIV and HCV screening negative. -Urine otherwise normal. -Glucose (sugar) mildly elevated as well as HgA1C, no diabetes but pre-diabetes. -Cholesterol mildly elevated. Because calculated 10 years CV risk is high, I think he will benefit from medication. Atorvastatin 20 mg daily with supper. F/U in 5-6 months. -Uric acid otherwise normal. Prostate test in normal range. -Liver test normal.

## 2017-10-05 MED ORDER — ATORVASTATIN CALCIUM 20 MG PO TABS: 20.0000 mg | ORAL_TABLET | Freq: Every day | ORAL | 1 refills | Status: DC

## 2017-10-05 NOTE — Addendum Note (Signed)
Addended by: Dorrene German on: 10/05/2017 11:49 AM   Modules accepted: Orders

## 2017-10-05 NOTE — Telephone Encounter (Signed)
Medication filled to pharmacy as requested.   

## 2018-06-14 ENCOUNTER — Encounter: Payer: Self-pay | Admitting: Family Medicine

## 2018-06-14 ENCOUNTER — Ambulatory Visit (INDEPENDENT_AMBULATORY_CARE_PROVIDER_SITE_OTHER): Payer: 59 | Admitting: Family Medicine

## 2018-06-14 VITALS — BP 130/80 | HR 77 | Temp 98.2°F | Resp 12 | Ht 69.0 in | Wt 232.1 lb

## 2018-06-14 DIAGNOSIS — M542 Cervicalgia: Secondary | ICD-10-CM

## 2018-06-14 DIAGNOSIS — M25552 Pain in left hip: Secondary | ICD-10-CM

## 2018-06-14 MED ORDER — MELOXICAM 15 MG PO TABS: 15.0000 mg | ORAL_TABLET | Freq: Every day | ORAL | 0 refills | Status: AC

## 2018-06-14 MED ORDER — METHOCARBAMOL 500 MG PO TABS: 500.0000 mg | ORAL_TABLET | Freq: Three times a day (TID) | ORAL | 0 refills | Status: AC | PRN

## 2018-06-14 NOTE — Patient Instructions (Addendum)
A few things to remember from today's visit:   Cervicalgia - Plan: meloxicam (MOBIC) 15 MG tablet, methocarbamol (ROBAXIN) 500 MG tablet  Pain of left hip joint  Local ice. Muscle relaxant can cause drowsiness. Range of motion exercises. Local massage and IcyHot with lidocaine. It might take a few weeks to recover, if worse or not better in 3 weeks please let me know, we can consider physical therapy. Monitor for numbness continue.   If hip pain persist, let me know if you are interested in orthopedist evaluation.  Please be sure medication list is accurate. If a new problem present, please set up appointment sooner than planned today.

## 2018-06-14 NOTE — Progress Notes (Signed)
ACUTE VISIT   HPI:  Chief Complaint  Patient presents with  . Shoulder Pain    left side, off and on for 3 weeks, sharp pain when turning head    Patrick Cannon is a 62 y.o. male, who is here today complaining of left trapezium and left cervical area. No pain in shoulder but left shoulder movement elicits pain. Problem started about 3 weeks ago, woke up with pain and has been intermittent. He has had episodes of cervical pain in the past but mild and have not lasted this long. Limitation of ROM. Pain is constant,dull/ache, max 7-8/10 with movement. Alleviated by rest.  Pain is not radiated. No RUE numbness or tingling. No Hx of trauma. He has not noted rash,local edema or erythema.  He has taken Tylenol and applied icy hot. Today am he tried local ice.   He is also c/o persistent left hip mild pain. Hx of generalized OA. He has had pain for a while but not getting better. No limitation of ROM. He has not tried OTC medication Problem is stable.   Review of Systems  Constitutional: Negative for activity change, appetite change, chills, fatigue and fever.  HENT: Negative for mouth sores and sore throat.   Respiratory: Negative for cough, shortness of breath and wheezing.   Cardiovascular: Negative for leg swelling.  Gastrointestinal: Negative for abdominal pain, nausea and vomiting.  Musculoskeletal: Positive for arthralgias and neck pain.  Skin: Negative for rash and wound.  Neurological: Negative for weakness, numbness and headaches.      Current Outpatient Medications on File Prior to Visit  Medication Sig Dispense Refill  . atorvastatin (LIPITOR) 20 MG tablet Take 1 tablet (20 mg total) by mouth daily. 90 tablet 1  . Multiple Vitamin (MULTIVITAMIN) tablet Take 1 tablet by mouth daily.    . varenicline (CHANTIX) 0.5 MG tablet Take 1 tablet (0.5 mg total) by mouth 2 (two) times daily. 60 tablet 3  . Zoster Vaccine Adjuvanted [facility]) injection 0.5 ml  in muscle and repeat in 8 weeks (Patient not taking: Reported on 06/14/2018) 0.5 mL 1   No current facility-administered medications on file prior to visit.      Past Medical History:  Diagnosis Date  . Arthritis   . Hyperlipidemia   . Hypertension    No Known Allergies  Social History   Socioeconomic History  . Marital status: Divorced    Spouse name: Not on file  . Number of children: Not on file  . Years of education: Not on file  . Highest education level: Not on file  Occupational History  . Not on file  Social Needs  . Financial resource strain: Not on file  . Food insecurity:    Worry: Not on file    Inability: Not on file  . Transportation needs:    Medical: Not on file    Non-medical: Not on file  Tobacco Use  . Smoking status: Current Every Day Smoker    Packs/day: 0.50  . Smokeless tobacco: Never Used  Substance and Sexual Activity  . Alcohol use: Yes    Comment: almost daily  . Drug use: No  . Sexual activity: Not on file  Lifestyle  . Physical activity:    Days per week: Not on file    Minutes per session: Not on file  . Stress: Not on file  Relationships  . Social connections:    Talks on phone: Not on file  Gets together: Not on file    Attends religious service: Not on file    Active member of club or organization: Not on file    Attends meetings of clubs or organizations: Not on file    Relationship status: Not on file  Other Topics Concern  . Not on file  Social History Narrative  . Not on file    Vitals:   06/14/18 1507  BP: 130/80  Pulse: 77  Resp: 12  Temp: 98.2 F (36.8 C)  SpO2: 96%   Body mass index is 34.28 kg/m.    Physical Exam  Nursing note and vitals reviewed. Constitutional: He is oriented to person, place, and time. He appears well-developed. No distress.  HENT:  Head: Normocephalic and atraumatic.  Mouth/Throat: Oropharynx is clear and moist and mucous membranes are normal.  Eyes: Conjunctivae and EOM are  normal.  Cardiovascular: Normal rate and regular rhythm.  No murmur heard. Respiratory: Effort normal and breath sounds normal. No respiratory distress.  Musculoskeletal: He exhibits no edema.       Cervical back: He exhibits decreased range of motion and tenderness. He exhibits no bony tenderness.       Thoracic back: He exhibits no tenderness and no bony tenderness.  No tenderness with movement or upon palpation. Non antalgic gait.   Lymphadenopathy:    He has no cervical adenopathy.  Neurological: He is alert and oriented to person, place, and time. He has normal strength. Gait normal.  Skin: Skin is warm. No rash noted. No erythema.  Psychiatric: He has a normal mood and affect.  Well groomed,good eye contact.    ASSESSMENT AND PLAN:  Mr. Abdel was seen today for shoulder pain.  Diagnoses and all orders for this visit:  Cervicalgia  Muscular pain,becasue no Hx of trauma I do not think imaging is needed. Local massage,ROM exercises may help. He prefers to hold on PT. Side effects of medications discussed. Instructed about warning signs. F/U as needed.   -     meloxicam (MOBIC) 15 MG tablet; Take 1 tablet (15 mg total) by mouth daily for 10 days. -     methocarbamol (ROBAXIN) 500 MG tablet; Take 1 tablet (500 mg total) by mouth every 8 (eight) hours as needed for up to 15 days for muscle spasms.  Pain of left hip joint  Pain seems to be chronic. Most likely related to OA. He will let me know if interested in ortho referral.  -     meloxicam (MOBIC) 15 MG tablet; Take 1 tablet (15 mg total) by mouth daily for 10 days.        Return if symptoms worsen or fail to improve.       Cayce Paschal G. Martinique, MD  [facility]. [place] office.

## 2018-09-30 ENCOUNTER — Encounter: Payer: 59 | Admitting: Family Medicine

## 2018-10-04 ENCOUNTER — Encounter: Payer: Self-pay | Admitting: Family Medicine

## 2018-10-04 ENCOUNTER — Ambulatory Visit (INDEPENDENT_AMBULATORY_CARE_PROVIDER_SITE_OTHER): Payer: 59 | Admitting: Family Medicine

## 2018-10-04 VITALS — BP 124/82 | HR 82 | Temp 98.2°F | Resp 12 | Ht 69.0 in | Wt 226.0 lb

## 2018-10-04 DIAGNOSIS — E785 Hyperlipidemia, unspecified: Secondary | ICD-10-CM

## 2018-10-04 DIAGNOSIS — Z130 Encounter for screening for diseases of the blood and blood-forming organs and certain disorders involving the immune mechanism: Secondary | ICD-10-CM | POA: Diagnosis not present

## 2018-10-04 DIAGNOSIS — M109 Gout, unspecified: Secondary | ICD-10-CM

## 2018-10-04 DIAGNOSIS — F17200 Nicotine dependence, unspecified, uncomplicated: Secondary | ICD-10-CM | POA: Diagnosis not present

## 2018-10-04 DIAGNOSIS — Z125 Encounter for screening for malignant neoplasm of prostate: Secondary | ICD-10-CM

## 2018-10-04 DIAGNOSIS — Z13228 Encounter for screening for other metabolic disorders: Secondary | ICD-10-CM

## 2018-10-04 DIAGNOSIS — N529 Male erectile dysfunction, unspecified: Secondary | ICD-10-CM | POA: Insufficient documentation

## 2018-10-04 DIAGNOSIS — Z1329 Encounter for screening for other suspected endocrine disorder: Secondary | ICD-10-CM

## 2018-10-04 DIAGNOSIS — Z0000 Encounter for general adult medical examination without abnormal findings: Secondary | ICD-10-CM | POA: Diagnosis not present

## 2018-10-04 DIAGNOSIS — M159 Polyosteoarthritis, unspecified: Secondary | ICD-10-CM

## 2018-10-04 LAB — COMPREHENSIVE METABOLIC PANEL
ALBUMIN: 4.5 g/dL (ref 3.5–5.2)
ALT: 47 U/L (ref 0–53)
AST: 31 U/L (ref 0–37)
Alkaline Phosphatase: 54 U/L (ref 39–117)
BUN: 17 mg/dL (ref 6–23)
CHLORIDE: 104 meq/L (ref 96–112)
CO2: 27 mEq/L (ref 19–32)
CREATININE: 1.09 mg/dL (ref 0.40–1.50)
Calcium: 10.1 mg/dL (ref 8.4–10.5)
GFR: 72.52 mL/min (ref >60.00)
GLUCOSE: 121 mg/dL — AB (ref 70–99)
Potassium: 4.4 mEq/L (ref 3.5–5.1)
Sodium: 139 mEq/L (ref 135–145)
TOTAL PROTEIN: 7.5 g/dL (ref 6.0–8.3)
Total Bilirubin: 0.8 mg/dL (ref 0.2–1.2)

## 2018-10-04 LAB — HEMOGLOBIN A1C: HEMOGLOBIN A1C: 6 % (ref 4.6–6.5)

## 2018-10-04 LAB — LIPID PANEL
CHOLESTEROL: 224 mg/dL — AB (ref 0–200)
HDL: 50.4 mg/dL (ref >39.00)
LDL CALC: 147 mg/dL — AB (ref 0–99)
NONHDL: 173.79
Total CHOL/HDL Ratio: 4
Triglycerides: 135 mg/dL (ref 0.0–149.0)
VLDL: 27 mg/dL (ref 0.0–40.0)

## 2018-10-04 LAB — URIC ACID: Uric Acid, Serum: 7.7 mg/dL (ref 4.0–7.8)

## 2018-10-04 LAB — TESTOSTERONE: Testosterone: 376.6 ng/dL (ref 300.00–890.00)

## 2018-10-04 LAB — PSA: PSA: 1.58 ng/mL (ref 0.10–4.00)

## 2018-10-04 LAB — TSH: TSH: 1.16 u[IU]/mL (ref 0.35–4.50)

## 2018-10-04 MED ORDER — COLCHICINE 0.6 MG PO TABS: 0.6000 mg | ORAL_TABLET | Freq: Every day | ORAL | 2 refills | Status: DC | PRN

## 2018-10-04 MED ORDER — SILDENAFIL CITRATE 20 MG PO TABS: 40.0000 mg | ORAL_TABLET | Freq: Every day | ORAL | 2 refills | Status: DC | PRN

## 2018-10-04 MED ORDER — VARENICLINE TARTRATE 0.5 MG PO TABS: 0.5000 mg | ORAL_TABLET | Freq: Two times a day (BID) | ORAL | 3 refills | Status: DC

## 2018-10-04 NOTE — Patient Instructions (Addendum)
A few things to remember from today's visit:   Routine general medical examination at a health care facility  Hyperlipidemia, unspecified hyperlipidemia type - Plan: Comprehensive metabolic panel, Lipid panel  Erectile dysfunction, unspecified erectile dysfunction type - Plan: TSH, Testosterone, sildenafil (REVATIO) 20 MG tablet  Tobacco use disorder - Plan: varenicline (CHANTIX) 0.5 MG tablet, Ambulatory Referral for Lung Cancer Scre  Prostate cancer screening - Plan: PSA(Must document that pt has been informed of limitations of PSA testing.)  Screening for endocrine, metabolic and immunity disorder - Plan: Comprehensive metabolic panel, Hemoglobin A1c  Gout, arthropathy - Plan: colchicine 0.6 MG tablet, Uric acid   At least 150 minutes of moderate exercise per week, daily brisk walking for 15-30 min is a good exercise option. Healthy diet low in saturated (animal) fats and sweets and consisting of fresh fruits and vegetables, lean meats such as fish and white chicken and whole grains.  - Vaccines:  Tdap vaccine every 10 years.  Shingles vaccine recommended at age 9, could be given after 63 years of age but not sure about insurance coverage.  Pneumonia vaccines:  Prevnar 47 at 55 and Pneumovax at 57.   -Screening recommendations for low/normal risk males:  Screening for diabetes at age 64 and every 3 years. Earlier screening if cardiovascular risk factors.   Colon cancer screening at age 54 and until age 58.  Prostate cancer screening: some controversy, starts usually at 38: Rectal exam and PSA.  Aortic Abdominal Aneurism once between 52 and 43 years old if ever smoker.  Also recommended:  1. Dental visit- Brush and floss your teeth twice daily; visit your dentist twice a year. 2. Eye doctor- Get an eye exam at least every 2 years. 3. Helmet use- Always wear a helmet when riding a bicycle, motorcycle, rollerblading or skateboarding. 4. Safe sex- If you may be exposed  to sexually transmitted infections, use a condom. 5. Seat belts- Seat belts can save your live; always wear one. 6. Smoke/Carbon Monoxide detectors- These detectors need to be installed on the appropriate level of your home. Replace batteries at least once a year. 7. Skin cancer- When out in the sun please cover up and use sunscreen 15 SPF or higher. 8. Violence- If anyone is threatening or hurting you, please tell your healthcare provider.  9. Drink alcohol in moderation- Limit alcohol intake to one drink or less per day. Never drink and drive.  Please be sure medication list is accurate. If a new problem present, please set up appointment sooner than planned today.

## 2018-10-04 NOTE — Assessment & Plan Note (Signed)
Educated about diagnosis, prognosis, and treatment options. Recommend Tylenol arthritis 3 times daily as needed.

## 2018-10-04 NOTE — Assessment & Plan Note (Signed)
He is interested in smoking cessation. Chantix 0.5 mg once daily for a week and then twice daily recommended. Adverse effects of tobacco use and benefits of smoking cessation discussed. Low density chest CT scan for lung cancer screening to be arranged.

## 2018-10-04 NOTE — Assessment & Plan Note (Signed)
Currently he is asymptomatic. Continue low purine diet. Colchicine 0.6 mg to take as needed, 2 tablets once upon onset.

## 2018-10-04 NOTE — Progress Notes (Signed)
HPI:  Patrick Cannon is a 62 y.o.male here today for his routine physical examination.  Last CPE: 09/28/17 He lives alone.  Regular exercise 3 or more times per week: He has not done so for 1 month.He was going to the gym 3 times per week Following a healthy diet: Yes,he cooks. Eat out once per week.   Chronic medical problems: OA,HLD,gout,tobacco use disorder,and HTN among some.  Hx of STD's: Denies. He has been sexually active for the past 6 months, always wears condoms.  Immunization History  Administered Date(s) Administered  . Influenza,inj,Quad PF,6+ Mos 09/28/2017    -Hep C screening: 09/28/17 NR   Last colon cancer screening: Colonoscopy 18/2018. Last prostate ca screening:  Lab Results  Component Value Date   PSA 1.80 09/28/2017   Nocturia stable, occasional. Denies dysuria,increased urinary frequency, gross hematuria,or decreased urine output.  -Denies high alcohol intake or Hx of illicit drug use. + Smoker,he is interested in quitting, did not get Chantix.  1 PPD from age 33 to date.  -Concerns and/or follow up today:   ED: Noted 6 months ago. He has not noted any anatomic deformity. He denies fatigue, depressed mood, decreased libido, nipple discharge, or headache.  Gout: He thinks he had a "little" episode of gout a few days ago, pain has resolved. This is the first gout attack he has had in the past year. He has not identified exacerbating or alleviating factors.  OA: He is asking for the difference between RA and OA. Hips and hands joint pain. Negative for joint edema or erythema.    Hyperlipidemia: Currently on nonpharmacologic treatment. Following a low fat diet: Yes.  He took Lipitor for about 3 months, discontinued due to worsening hip and lower back aching.   Lab Results  Component Value Date   CHOL 214 (H) 09/28/2017   HDL 52.10 09/28/2017   LDLCALC 138 (H) 09/28/2017   TRIG 117.0 09/28/2017   CHOLHDL 4 09/28/2017         Review of Systems  Constitutional: Negative for activity change, appetite change, fatigue and fever.  HENT: Negative for dental problem, nosebleeds, sore throat and trouble swallowing.   Eyes: Negative for redness and visual disturbance.  Respiratory: Negative for cough, shortness of breath and wheezing.   Cardiovascular: Negative for chest pain, palpitations and leg swelling.  Gastrointestinal: Negative for abdominal pain, blood in stool, nausea and vomiting.  Endocrine: Negative for polydipsia, polyphagia and polyuria.  Genitourinary: Negative for decreased urine volume, dysuria, genital sores, hematuria and testicular pain.  Musculoskeletal: Positive for arthralgias and back pain. Negative for gait problem and myalgias.  Skin: Negative for color change and rash.  Neurological: Negative for syncope, weakness and headaches.  Hematological: Negative for adenopathy. Does not bruise/bleed easily.  Psychiatric/Behavioral: Negative for confusion and sleep disturbance. The patient is not nervous/anxious.   All other systems reviewed and are negative.    Current Outpatient Medications on File Prior to Visit  Medication Sig Dispense Refill  . Multiple Vitamin (MULTIVITAMIN) tablet Take 1 tablet by mouth daily.     No current facility-administered medications on file prior to visit.      Past Medical History:  Diagnosis Date  . Arthritis   . Hyperlipidemia   . Hypertension     Past Surgical History:  Procedure Laterality Date  . APPENDECTOMY     age 68  . COLONOSCOPY    . TONSILLECTOMY     age 20    No  Known Allergies  Family History  Problem Relation Age of Onset  . Cancer Mother        Glyoblastoma  . Cancer Father        Lung  . Diabetes Neg Hx   . Hyperlipidemia Neg Hx   . Hypertension Neg Hx     Social History   Socioeconomic History  . Marital status: Divorced    Spouse name: Not on file  . Number of children: Not on file  . Years of education:  Not on file  . Highest education level: Not on file  Occupational History  . Not on file  Social Needs  . Financial resource strain: Not on file  . Food insecurity:    Worry: Not on file    Inability: Not on file  . Transportation needs:    Medical: Not on file    Non-medical: Not on file  Tobacco Use  . Smoking status: Current Every Day Smoker    Packs/day: 0.50  . Smokeless tobacco: Never Used  Substance and Sexual Activity  . Alcohol use: Yes    Comment: almost daily  . Drug use: No  . Sexual activity: Not on file  Lifestyle  . Physical activity:    Days per week: Not on file    Minutes per session: Not on file  . Stress: Not on file  Relationships  . Social connections:    Talks on phone: Not on file    Gets together: Not on file    Attends religious service: Not on file    Active member of club or organization: Not on file    Attends meetings of clubs or organizations: Not on file    Relationship status: Not on file  Other Topics Concern  . Not on file  Social History Narrative  . Not on file     Vitals:   10/04/18 0736  BP: 124/82  Pulse: 82  Resp: 12  Temp: 98.2 F (36.8 C)  SpO2: 94%   Body mass index is 33.37 kg/m.   Wt Readings from Last 3 Encounters:  10/04/18 226 lb (102.5 kg)  06/14/18 232 lb 2 oz (105.3 kg)  09/28/17 226 lb 2 oz (102.6 kg)     Physical Exam  Nursing note and vitals reviewed. Constitutional: He is oriented to person, place, and time. He appears well-developed. No distress.  HENT:  Head: Normocephalic and atraumatic.  Right Ear: Tympanic membrane, external ear and ear canal normal.  Left Ear: Tympanic membrane, external ear and ear canal normal.  Mouth/Throat: Oropharynx is clear and moist and mucous membranes are normal.  Eyes: Pupils are equal, round, and reactive to light. Conjunctivae and EOM are normal.  Neck: Normal range of motion. No tracheal deviation present. No thyromegaly present.  Cardiovascular: Normal  rate and regular rhythm.  No murmur heard. Pulses:      Dorsalis pedis pulses are 2+ on the right side, and 2+ on the left side.  Respiratory: Effort normal and breath sounds normal. No respiratory distress.  GI: Soft. He exhibits no mass. There is no hepatomegaly. There is no tenderness.  Genitourinary:  Genitourinary Comments: Refused,no concerns.  Musculoskeletal: He exhibits no edema or tenderness.  No major deformities appreciated and no signs of synovitis. Some Heberden's node and Bouchard's nodes.   Lymphadenopathy:    He has no cervical adenopathy.       Right: No supraclavicular adenopathy present.       Left: No supraclavicular adenopathy present.  Neurological: He is alert and oriented to person, place, and time. He has normal strength. No cranial nerve deficit or sensory deficit. Coordination and gait normal.  Reflex Scores:      Bicep reflexes are 2+ on the right side and 2+ on the left side.      Patellar reflexes are 2+ on the right side and 2+ on the left side. Skin: Skin is warm. No erythema.  Psychiatric: He has a normal mood and affect. Cognition and memory are normal.     ASSESSMENT AND PLAN:  Patrick Cannon was seen today for annual exam.   Orders Placed This Encounter  Procedures  . Comprehensive metabolic panel  . Lipid panel  . Hemoglobin A1c  . TSH  . PSA(Must document that pt has been informed of limitations of PSA testing.)  . Testosterone  . Uric acid  . Ambulatory Referral for Lung Cancer Scre   Lab Results  Component Value Date   LABURIC 7.7 10/04/2018   Lab Results  Component Value Date   TSH 1.16 10/04/2018   Lab Results  Component Value Date   TESTOSTERONE 376.60 10/04/2018   Lab Results  Component Value Date   PSA 1.58 10/04/2018   Lab Results  Component Value Date   ALT 47 10/04/2018   AST 31 10/04/2018   ALKPHOS 54 10/04/2018   BILITOT 0.8 10/04/2018   Lab Results  Component Value Date   CREATININE 1.09 10/04/2018    BUN 17 10/04/2018   NA 139 10/04/2018   K 4.4 10/04/2018   CL 104 10/04/2018   CO2 27 10/04/2018    Routine general medical examination at a health care facility We discussed the importance of regular physical activity and healthy diet for prevention of chronic illness and/or complications. Preventive guidelines reviewed. Vaccination up to date.  Next CPE in a year.  The 10-year ASCVD risk score Mikey Bussing DC Brooke Bonito., et al., 2013) is: 16.3%   Values used to calculate the score:     Age: 37 years     Sex: Male     Is Non-Hispanic African American: No     Diabetic: No     Tobacco smoker: Yes     Systolic Blood Pressure: 353 mmHg     Is BP treated: No     HDL Cholesterol: 50.4 mg/dL     Total Cholesterol: 224 mg/dL  Prostate cancer screening - PSA(Must document that pt has been informed of limitations of PSA testing.)  Screening for endocrine, metabolic and immunity disorder - Comprehensive metabolic panel - Hemoglobin A1c   Gout, arthropathy Currently he is asymptomatic. Continue low purine diet. Colchicine 0.6 mg to take as needed, 2 tablets once upon onset.  Tobacco use disorder He is interested in smoking cessation. Chantix 0.5 mg once daily for a week and then twice daily recommended. Adverse effects of tobacco use and benefits of smoking cessation discussed. Low density chest CT scan for lung cancer screening to be arranged.  Hyperlipidemia He did not tolerate atorvastatin. We will wait for lab results before further recommendations in regard to medication. Continue low fat diet.   ED (erectile dysfunction) After discussion of some side effects, he agrees with trying sildenafil. Further recommendation will be given according to lab results.  Generalized osteoarthritis of hand Educated about diagnosis, prognosis, and treatment options. Recommend Tylenol arthritis 3 times daily as needed.     Return in 1 year (on 10/05/2019).    Asra Gambrel G. Martinique, MD  [facility]. [place] office.

## 2018-10-04 NOTE — Assessment & Plan Note (Signed)
After discussion of some side effects, he agrees with trying sildenafil. Further recommendation will be given according to lab results.

## 2018-10-04 NOTE — Assessment & Plan Note (Signed)
He did not tolerate atorvastatin. We will wait for lab results before further recommendations in regard to medication. Continue low fat diet.

## 2018-10-09 ENCOUNTER — Encounter: Payer: Self-pay | Admitting: Family Medicine

## 2018-10-12 ENCOUNTER — Telehealth: Payer: Self-pay | Admitting: *Deleted

## 2018-10-12 NOTE — Telephone Encounter (Signed)
Prior auth for Sildenafil citrate 20mg  sent to [email or web address] [id number].

## 2018-10-19 ENCOUNTER — Encounter: Payer: Self-pay | Admitting: *Deleted

## 2018-10-19 NOTE — Telephone Encounter (Signed)
Noted. Faxed placed on doctor's desk for review.

## 2018-10-19 NOTE — Telephone Encounter (Signed)
Fax received stating the request was denied and this was given to Dr Doug Sou asst.

## 2018-10-21 ENCOUNTER — Other Ambulatory Visit: Payer: Self-pay | Admitting: *Deleted

## 2018-10-21 MED ORDER — PRAVASTATIN SODIUM 20 MG PO TABS: 20.0000 mg | ORAL_TABLET | Freq: Every day | ORAL | 3 refills | Status: DC

## 2019-08-24 ENCOUNTER — Encounter: Payer: Self-pay | Admitting: Family Medicine

## 2019-10-09 ENCOUNTER — Other Ambulatory Visit (INDEPENDENT_AMBULATORY_CARE_PROVIDER_SITE_OTHER): Payer: 59

## 2019-10-09 ENCOUNTER — Encounter: Payer: Self-pay | Admitting: Family Medicine

## 2019-10-09 ENCOUNTER — Ambulatory Visit (INDEPENDENT_AMBULATORY_CARE_PROVIDER_SITE_OTHER): Payer: 59 | Admitting: Family Medicine

## 2019-10-09 ENCOUNTER — Other Ambulatory Visit: Payer: Self-pay

## 2019-10-09 VITALS — BP 128/80 | HR 84 | Temp 96.6°F | Resp 12 | Ht 69.0 in | Wt 223.5 lb

## 2019-10-09 DIAGNOSIS — Z125 Encounter for screening for malignant neoplasm of prostate: Secondary | ICD-10-CM | POA: Diagnosis not present

## 2019-10-09 DIAGNOSIS — Z0000 Encounter for general adult medical examination without abnormal findings: Secondary | ICD-10-CM | POA: Diagnosis not present

## 2019-10-09 DIAGNOSIS — E785 Hyperlipidemia, unspecified: Secondary | ICD-10-CM | POA: Diagnosis not present

## 2019-10-09 DIAGNOSIS — Z13228 Encounter for screening for other metabolic disorders: Secondary | ICD-10-CM | POA: Diagnosis not present

## 2019-10-09 DIAGNOSIS — Z1329 Encounter for screening for other suspected endocrine disorder: Secondary | ICD-10-CM

## 2019-10-09 DIAGNOSIS — Z130 Encounter for screening for diseases of the blood and blood-forming organs and certain disorders involving the immune mechanism: Secondary | ICD-10-CM

## 2019-10-09 DIAGNOSIS — Z23 Encounter for immunization: Secondary | ICD-10-CM

## 2019-10-09 DIAGNOSIS — L298 Other pruritus: Secondary | ICD-10-CM

## 2019-10-09 DIAGNOSIS — F17200 Nicotine dependence, unspecified, uncomplicated: Secondary | ICD-10-CM

## 2019-10-09 DIAGNOSIS — I10 Essential (primary) hypertension: Secondary | ICD-10-CM

## 2019-10-09 DIAGNOSIS — M109 Gout, unspecified: Secondary | ICD-10-CM

## 2019-10-09 DIAGNOSIS — N529 Male erectile dysfunction, unspecified: Secondary | ICD-10-CM

## 2019-10-09 LAB — COMPREHENSIVE METABOLIC PANEL
ALT: 32 U/L (ref 0–53)
AST: 22 U/L (ref 0–37)
Albumin: 4.2 g/dL (ref 3.5–5.2)
Alkaline Phosphatase: 56 U/L (ref 39–117)
BUN: 16 mg/dL (ref 6–23)
CO2: 25 mEq/L (ref 19–32)
Calcium: 9.6 mg/dL (ref 8.4–10.5)
Chloride: 108 mEq/L (ref 96–112)
Creatinine, Ser: 1.05 mg/dL (ref 0.40–1.50)
GFR: 71.01 mL/min (ref >60.00)
Glucose, Bld: 122 mg/dL — ABNORMAL HIGH (ref 70–99)
Potassium: 4.4 mEq/L (ref 3.5–5.1)
Sodium: 141 mEq/L (ref 135–145)
Total Bilirubin: 0.7 mg/dL (ref 0.2–1.2)
Total Protein: 6.8 g/dL (ref 6.0–8.3)

## 2019-10-09 LAB — PSA: PSA: 1.81 ng/mL (ref 0.10–4.00)

## 2019-10-09 LAB — LIPID PANEL
Cholesterol: 213 mg/dL — ABNORMAL HIGH (ref 0–200)
HDL: 46.1 mg/dL (ref >39.00)
LDL Cholesterol: 141 mg/dL — ABNORMAL HIGH (ref 0–99)
NonHDL: 166.57
Total CHOL/HDL Ratio: 5
Triglycerides: 128 mg/dL (ref 0.0–149.0)
VLDL: 25.6 mg/dL (ref 0.0–40.0)

## 2019-10-09 LAB — HEMOGLOBIN A1C: Hgb A1c MFr Bld: 6 % (ref 4.6–6.5)

## 2019-10-09 MED ORDER — SILDENAFIL CITRATE 20 MG PO TABS: 40.0000 mg | ORAL_TABLET | Freq: Every day | ORAL | 3 refills | Status: DC | PRN

## 2019-10-09 MED ORDER — TRIAMCINOLONE ACETONIDE 0.1 % EX CREA: 1.0000 "application " | TOPICAL_CREAM | Freq: Two times a day (BID) | CUTANEOUS | 1 refills | Status: DC | PRN

## 2019-10-09 NOTE — Assessment & Plan Note (Signed)
We discussed adverse effects of tobacco use as well as benefits. He does not feel ready to quit smoking but has decreased cigarettes per day. Encouraged smoking cessation.

## 2019-10-09 NOTE — Patient Instructions (Signed)
A few things to remember from today's visit:   Routine general medical examination at a health care facility  Need for influenza vaccination - Plan: Flu Vaccine QUAD 6+ mos PF IM (Fluarix Quad PF)  Hyperlipidemia, unspecified hyperlipidemia type - Plan: Lipid panel  Tobacco use disorder  Gout, arthropathy  Screening for endocrine, metabolic and immunity disorder - Plan: Comprehensive metabolic panel, Hemoglobin A1c  Erectile dysfunction, unspecified erectile dysfunction type - Plan: sildenafil (REVATIO) 20 MG tablet   At least 150 minutes of moderate exercise per week, daily brisk walking for 15-30 min is a good exercise option. Healthy diet low in saturated (animal) fats and sweets and consisting of fresh fruits and vegetables, lean meats such as fish and white chicken and whole grains.  - Vaccines:  Tdap vaccine every 10 years.  Shingles vaccine recommended at age 41, could be given after 64 years of age but not sure about insurance coverage.  Pneumonia vaccines:  Pneumovax at 82.   -Screening recommendations for low/normal risk males:  Screening for diabetes at age 19 and every 3 years. Earlier screening if cardiovascular risk factors.  Colon cancer screening at age 47 and until age 46.  Prostate cancer screening: some controversy, starts usually at 32: Rectal exam and PSA.  Aortic Abdominal Aneurism once between 72 and 38 years old if ever smoker.  Also recommended:  1. Dental visit- Brush and floss your teeth twice daily; visit your dentist twice a year. 2. Eye doctor- Get an eye exam at least every 2 years. 3. Helmet use- Always wear a helmet when riding a bicycle, motorcycle, rollerblading or skateboarding. 4. Safe sex- If you may be exposed to sexually transmitted infections, use a condom. 5. Seat belts- Seat belts can save your live; always wear one. 6. Smoke/Carbon Monoxide detectors- These detectors need to be installed on the appropriate level of your home.  Replace batteries at least once a year. 7. Skin cancer- When out in the sun please cover up and use sunscreen 15 SPF or higher. 8. Violence- If anyone is threatening or hurting you, please tell your healthcare provider.  9. Drink alcohol in moderation- Limit alcohol intake to one drink or less per day. Never drink and drive.   Please be sure medication list is accurate. If a new problem present, please set up appointment sooner than planned today.

## 2019-10-09 NOTE — Assessment & Plan Note (Signed)
He has been asymptomatic. Colchicine 0.6 mg 2 tablets upon acute onset. Continue low purine diet.

## 2019-10-09 NOTE — Progress Notes (Signed)
HPI:  [name] is a 63 y.o.male here today for his routine physical examination.  Last CPE: 10/04/18 No new problems since his last visit. He lives with his son, he moved in with him in 04/2019.  Regular exercise 3 or more times per week: 2-3 times per week he lifts wt at home. Following a healthy diet: His son cooks. He eats out fridays and Saturdays.   Chronic medical problems: Hypertension, hyperlipidemia, gout, tobacco use, OA, and obesity among some.  Hx of STD's: Negative.  Immunization History  Administered Date(s) Administered  . Influenza,inj,Quad PF,6+ Mos 09/28/2017, 10/09/2019    -Hep C screening: 09/2017 NR.  Last colon cancer screening: Colonoscopy in 11/2016. Last prostate ca screening: Lab Results  Component Value Date   PSA 1.58 10/04/2018   PSA 1.80 09/28/2017    -Denies high alcohol intake or Hx of illicit drug use. He drinks 3 beers daily. He has tried Chantix for smoking cessation, he does not feel ready to quit but has decreased number of cigarettes per day.  -Concerns and/or follow up today:   ED: Currently he is on sildenafil 40 mg daily as needed. Medication is helping and he is not reporting side effects. Hypertension: Currently he is on nonpharmacologic treatment. He has not checking BP regularly.  Hyperlipidemia: He took pravastatin in the past, discontinued because left hip pain that resolved after stopping medication. He is following a low fat diet. Lab Results  Component Value Date   CHOL 224 (H) 10/04/2018   HDL 50.40 10/04/2018   LDLCALC 147 (H) 10/04/2018   TRIG 135.0 10/04/2018   CHOLHDL 4 10/04/2018   Gout: He has not had an episode since his last visit, 10/2018. Joint pain attributed to OA. No joint edema or erythema. No limitation of ROM.  Today is complaining about pruritic rash on left wrist, which has been going on intermittently for years. He has used OTC moisturizer. Problem seems to be stable. He has not  identified exacerbating or alleviating factors.  Review of Systems  Constitutional: Negative for activity change, appetite change, fatigue and fever.  HENT: Negative for dental problem, nosebleeds, sore throat and trouble swallowing.   Eyes: Negative for redness and visual disturbance.  Respiratory: Negative for cough, shortness of breath and wheezing.   Cardiovascular: Negative for chest pain, palpitations and leg swelling.  Gastrointestinal: Negative for abdominal pain, blood in stool, nausea and vomiting.  Endocrine: Negative for cold intolerance, heat intolerance, polydipsia, polyphagia and polyuria.  Genitourinary: Negative for decreased urine volume, discharge, dysuria, genital sores, hematuria and testicular pain.  Musculoskeletal: Negative for gait problem and myalgias. + Arthralgias. Skin: Negative for color change and wound.  + Rash.  Allergic/Immunologic: Negative for environmental allergies.  Neurological: Negative for syncope, weakness and headaches.  Hematological: Negative for adenopathy. Does not bruise/bleed easily.  Psychiatric/Behavioral: Negative for confusion. The patient is not nervous/anxious.   All other systems reviewed and are negative.  Current Outpatient Medications on File Prior to Visit  Medication Sig Dispense Refill  . colchicine 0.6 MG tablet Take 1 tablet (0.6 mg total) by mouth daily as needed. 30 tablet 2  . Multiple Vitamin (MULTIVITAMIN) tablet Take 1 tablet by mouth daily.     No current facility-administered medications on file prior to visit.    Past Medical History:  Diagnosis Date  . Arthritis   . Hyperlipidemia   . Hypertension    Past Surgical History:  Procedure Laterality Date  . APPENDECTOMY  age 7  . COLONOSCOPY    . TONSILLECTOMY     age 56   Allergies  Allergen Reactions  . Pravastatin Other (See Comments)    Joint pain    Family History  Problem Relation Age of Onset  . Cancer Mother        Glyoblastoma  . Cancer  Father        Lung  . Diabetes Neg Hx   . Hyperlipidemia Neg Hx   . Hypertension Neg Hx    Social History   Socioeconomic History  . Marital status: Divorced    Spouse name: Not on file  . Number of children: Not on file  . Years of education: Not on file  . Highest education level: Not on file  Occupational History  . Not on file  Social Needs  . Financial resource strain: Not on file  . Food insecurity    Worry: Not on file    Inability: Not on file  . Transportation needs    Medical: Not on file    Non-medical: Not on file  Tobacco Use  . Smoking status: Current Every Day Smoker    Packs/day: 0.50  . Smokeless tobacco: Never Used  Substance and Sexual Activity  . Alcohol use: Yes    Comment: almost daily  . Drug use: No  . Sexual activity: Not on file  Lifestyle  . Physical activity    Days per week: Not on file    Minutes per session: Not on file  . Stress: Not on file  Relationships  . Social Herbalist on phone: Not on file    Gets together: Not on file    Attends religious service: Not on file    Active member of club or organization: Not on file    Attends meetings of clubs or organizations: Not on file    Relationship status: Not on file  Other Topics Concern  . Not on file  Social History Narrative  . Not on file    Today's Vitals   10/09/19 0652  BP: 128/80  Pulse: 84  Resp: 12  Temp: (!) 96.6 F (35.9 C)  TempSrc: Tympanic  SpO2: 95%  Weight: 223 lb 8 oz (101.4 kg)  Height: 5\' 9"  (1.753 m)   Body mass index is 33.01 kg/m.  Wt Readings from Last 3 Encounters:  10/09/19 223 lb 8 oz (101.4 kg)  10/04/18 226 lb (102.5 kg)  06/14/18 232 lb 2 oz (105.3 kg)    Physical Exam  Nursing note and vitals reviewed. Constitutional: He is oriented to person, place, and time. He appears well-developed. No distress.  HENT:  Head: Normocephalic and atraumatic.  Right Ear: Tympanic membrane, external ear and ear canal normal.  Left Ear:  Tympanic membrane, external ear and ear canal normal.  Mouth/Throat: Oropharynx is clear and moist and mucous membranes are normal.  Eyes: Pupils are equal, round, and reactive to light. Conjunctivae and EOM are normal.  Neck: Normal range of motion. No tracheal deviation present. No thyromegaly present.  Cardiovascular: Normal rate and regular rhythm.  No murmur heard. Pulses:      Dorsalis pedis pulses are 2+ on the right side and 2+ on the left side.  Respiratory: Effort normal and breath sounds normal. No respiratory distress.  GI: Soft. He exhibits no mass. There is no hepatomegaly. There is no abdominal tenderness.  Genitourinary:    Genitourinary Comments: Refused,no concerns.  Musculoskeletal:  General: No tenderness or edema.     Comments: No major deformities appreciated and no signs of synovitis.  Lymphadenopathy:    He has no cervical adenopathy.       Right: No supraclavicular adenopathy present.       Left: No supraclavicular adenopathy present.  Neurological: He is alert and oriented to person, place, and time. He has normal strength. No cranial nerve deficit or sensory deficit. Coordination and gait normal.  Reflex Scores:      Bicep reflexes are 2+ on the right side and 2+ on the left side.      Patellar reflexes are 2+ on the right side and 2+ on the left side. Skin: Skin is warm. No erythema. Micropapular erythematous confluent rash on ulnar aspect left wrist. No tenderness or edema appreciated. Psychiatric: He has a normal mood and affect. Cognition and memory are normal.  Well groomed,good eye contact.   ASSESSMENT AND PLAN:  Mr. MILIANO SARDO was here today annual physical examination.  Orders Placed This Encounter  Procedures  . Flu Vaccine QUAD 6+ mos PF IM (Fluarix Quad PF)  . Comprehensive metabolic panel  . Lipid panel  . Hemoglobin A1c  . PSA    Lab Results  Component Value Date   HGBA1C 6.0 10/09/2019   Lab Results  Component Value Date    CHOL 213 (H) 10/09/2019   HDL 46.10 10/09/2019   LDLCALC 141 (H) 10/09/2019   TRIG 128.0 10/09/2019   CHOLHDL 5 10/09/2019   Lab Results  Component Value Date   CREATININE 1.05 10/09/2019   BUN 16 10/09/2019   NA 141 10/09/2019   K 4.4 10/09/2019   CL 108 10/09/2019   CO2 25 10/09/2019   Lab Results  Component Value Date   CHOL 213 (H) 10/09/2019   HDL 46.10 10/09/2019   LDLCALC 141 (H) 10/09/2019   TRIG 128.0 10/09/2019   CHOLHDL 5 10/09/2019    Routine general medical examination at a health care facility We discussed the importance of regular physical activity and healthy diet for prevention of chronic illness and/or complications. Preventive guidelines reviewed. Vaccination up to date. Next CPE in a year.  The 10-year ASCVD risk score Mikey Bussing DC Brooke Bonito., et al., 2013) is: 18.2%   Values used to calculate the score:     Age: 56 years     Sex: Male     Is Non-Hispanic African American: No     Diabetic: No     Tobacco smoker: Yes     Systolic Blood Pressure: 0000000 mmHg     Is BP treated: No     HDL Cholesterol: 46.1 mg/dL     Total Cholesterol: 213 mg/dL  Need for influenza vaccination -     Flu Vaccine QUAD 6+ mos PF IM (Fluarix Quad PF)  Screening for endocrine, metabolic and immunity disorder -     Comprehensive metabolic panel -     Hemoglobin A1c  Pruritic erythematous rash We discussed possible etiologies. Problem is chronic,?  Eczema. Topical steroid recommended, twice daily a small amount as needed.  -     triamcinolone cream (KENALOG) 0.1 %; Apply 1 application topically 2 (two) times daily as needed. Left forearm.  Hyperlipidemia He did not tolerate pravastatin in the past. Continue nonpharmacologic treatment for now. Further recommendation will be given according to 10-year CVD risk and lipid panel results.  Hypertension, essential, benign BP has been adequately controlled on nonpharmacologic treatment. Recommend monitoring BP at home  periodically.  Low-salt diet recommended. Annual eye exam.  Tobacco use disorder We discussed adverse effects of tobacco use as well as benefits. He does not feel ready to quit smoking but has decreased cigarettes per day. Encouraged smoking cessation.  Gout, arthropathy He has been asymptomatic. Colchicine 0.6 mg 2 tablets upon acute onset. Continue low purine diet.  ED (erectile dysfunction) Problem is well controlled. No changes in sildenafil 20 mg, 2 tablets daily as needed. We discussed some side effects of medications. Continue annual follow-up.   Return in 1 year (on 10/08/2020) for cpe.     G. Martinique, MD  [facility]. [place] office.

## 2019-10-09 NOTE — Assessment & Plan Note (Signed)
BP has been adequately controlled on nonpharmacologic treatment. Recommend monitoring BP at home periodically. Low-salt diet recommended. Annual eye exam.

## 2019-10-09 NOTE — Assessment & Plan Note (Signed)
He did not tolerate pravastatin in the past. Continue nonpharmacologic treatment for now. Further recommendation will be given according to 10-year CVD risk and lipid panel results.

## 2019-10-09 NOTE — Assessment & Plan Note (Signed)
Problem is well controlled. No changes in sildenafil 20 mg, 2 tablets daily as needed. We discussed some side effects of medications. Continue annual follow-up.

## 2019-10-18 ENCOUNTER — Encounter: Payer: 59 | Admitting: Family Medicine

## 2020-09-23 ENCOUNTER — Ambulatory Visit (INDEPENDENT_AMBULATORY_CARE_PROVIDER_SITE_OTHER): Payer: 59 | Admitting: Family Medicine

## 2020-09-23 ENCOUNTER — Ambulatory Visit (INDEPENDENT_AMBULATORY_CARE_PROVIDER_SITE_OTHER): Payer: 59

## 2020-09-23 ENCOUNTER — Other Ambulatory Visit: Payer: Self-pay

## 2020-09-23 ENCOUNTER — Encounter: Payer: Self-pay | Admitting: Family Medicine

## 2020-09-23 VITALS — BP 124/88 | HR 81 | Temp 98.4°F | Resp 16 | Ht 69.0 in | Wt 223.6 lb

## 2020-09-23 DIAGNOSIS — Z136 Encounter for screening for cardiovascular disorders: Secondary | ICD-10-CM | POA: Diagnosis not present

## 2020-09-23 DIAGNOSIS — F17200 Nicotine dependence, unspecified, uncomplicated: Secondary | ICD-10-CM

## 2020-09-23 DIAGNOSIS — Z1329 Encounter for screening for other suspected endocrine disorder: Secondary | ICD-10-CM

## 2020-09-23 DIAGNOSIS — Z23 Encounter for immunization: Secondary | ICD-10-CM

## 2020-09-23 DIAGNOSIS — Z125 Encounter for screening for malignant neoplasm of prostate: Secondary | ICD-10-CM

## 2020-09-23 DIAGNOSIS — Z0000 Encounter for general adult medical examination without abnormal findings: Secondary | ICD-10-CM | POA: Diagnosis not present

## 2020-09-23 DIAGNOSIS — Z130 Encounter for screening for diseases of the blood and blood-forming organs and certain disorders involving the immune mechanism: Secondary | ICD-10-CM

## 2020-09-23 DIAGNOSIS — E785 Hyperlipidemia, unspecified: Secondary | ICD-10-CM | POA: Diagnosis not present

## 2020-09-23 DIAGNOSIS — M109 Gout, unspecified: Secondary | ICD-10-CM

## 2020-09-23 DIAGNOSIS — M25551 Pain in right hip: Secondary | ICD-10-CM

## 2020-09-23 DIAGNOSIS — I10 Essential (primary) hypertension: Secondary | ICD-10-CM | POA: Diagnosis not present

## 2020-09-23 DIAGNOSIS — Z13228 Encounter for screening for other metabolic disorders: Secondary | ICD-10-CM

## 2020-09-23 NOTE — Patient Instructions (Signed)
A few things to remember from today's visit:  Routine general medical examination at a health care facility  Hypertension, essential, benign  Hyperlipidemia, unspecified hyperlipidemia type - Plan: Lipid panel  Right hip pain - Plan: Ambulatory referral to Physical Therapy, DG Hip Unilat W OR W/O Pelvis 1V Right  Tobacco use disorder - Plan: Ambulatory Referral for Lung Cancer Scre, VAS [place] AAA DUPLEX  Prostate cancer screening - Plan: PSA  Screening for endocrine, metabolic and immunity disorder - Plan: COMPLETE METABOLIC PANEL WITH GFR, Hemoglobin A1c  Encounter for abdominal aortic aneurysm (AAA) screening - Plan: VAS [place] AAA DUPLEX  Gout, unspecified cause, unspecified chronicity, unspecified site - Plan: Uric acid  If you need refills please call your pharmacy. Do not use My Chart to request refills or for acute issues that need immediate attention.    Please be sure medication list is accurate. If a new problem present, please set up appointment sooner than planned today.  At least 150 minutes of moderate exercise per week, daily brisk walking for 15-30 min is a good exercise option. Healthy diet low in saturated (animal) fats and sweets and consisting of fresh fruits and vegetables, lean meats such as fish and white chicken and whole grains.  - Vaccines:  Tdap vaccine every 10 years.  Shingles vaccine recommended at age 55, could be given after 65 years of age but not sure about insurance coverage.  Pneumonia vaccines: Pneumovax at 59  -Screening recommendations for low/normal risk males:  Screening for diabetes at age 33 and every 3 years. Earlier screening if cardiovascular risk factors.   Lipid screening at 35 and every 3 years. Screening starts in younger males with cardiovascular risk factors.N/A  Colon cancer screening is now at age 73 but your insurance may not cover until age 47 .screening is recommended age 27.  Prostate cancer screening: some controversy,  starts usually at 58: Rectal exam and PSA.  Aortic Abdominal Aneurism once between 6 and 63 years old if ever smoker.  Also recommended:  1. Dental visit- Brush and floss your teeth twice daily; visit your dentist twice a year. 2. Eye doctor- Get an eye exam at least every 2 years. 3. Helmet use- Always wear a helmet when riding a bicycle, motorcycle, rollerblading or skateboarding. 4. Safe sex- If you may be exposed to sexually transmitted infections, use a condom. 5. Seat belts- Seat belts can save your live; always wear one. 6. Smoke/Carbon Monoxide detectors- These detectors need to be installed on the appropriate level of your home. Replace batteries at least once a year. 7. Skin cancer- When out in the sun please cover up and use sunscreen 15 SPF or higher. 8. Violence- If anyone is threatening or hurting you, please tell your healthcare provider.  9. Drink alcohol in moderation- Limit alcohol intake to one drink or less per day. Never drink and drive.

## 2020-09-23 NOTE — Progress Notes (Signed)
HPI:  [name] is a 64 y.o.male here today for his routine physical examination.  Last CPE: 10/09/19. His son is living with him.  Regular exercise 3 or more times per week: For the past month he has been exercising 4 times per week,paloton. Following a healthy diet: In general he does.  Chronic medical problems: Tobacco use disorder,HTN,HLD,prediabetes, and OA.  Immunization History  Administered Date(s) Administered  . Influenza,inj,Quad PF,6+ Mos 09/28/2017, 10/09/2019   -Hep C screening: 09/28/17 NR.  Last colon cancer screening: 02/18/16, 5 years f/u was recommended. Last prostate ca screening: PSA was normal at 1.8 in 10/2019. Nocturia x 0.  Smoker since age [age]. 1 PPD in average. 2 beers daily.  -Concerns and/or follow up today:   Right posterior hip pain, soreness. Sharp/stubbing pain, it lasts for a few hours. 7/10 max. Sometimes interfering with sleep. Pain wakes him up when rolling in bed. Pain is also exacerbated  By getting up after prolong sitting.  No hx of trauma or unusual activity. No numbness,tingling,or burning sensation.  Gout: He takes Colchicine 0.6 mg daily as needed. He has not had a gout attack in years.  HLD:He is on non pharmacologic treatment.  Lab Results  Component Value Date   CHOL 213 (H) 10/09/2019   HDL 46.10 10/09/2019   LDLCALC 141 (H) 10/09/2019   TRIG 128.0 10/09/2019   CHOLHDL 5 10/09/2019   Prediabetes: Negative for polydipsia,polyuria, or polyphagia.  Lab Results  Component Value Date   HGBA1C 6.0 10/09/2019   HTN: He is on non pharmacologic treatment. He does not check BP at home.  Lab Results  Component Value Date   CREATININE 1.05 10/09/2019   BUN 16 10/09/2019   NA 141 10/09/2019   K 4.4 10/09/2019   CL 108 10/09/2019   CO2 25 10/09/2019   Review of Systems  Constitutional: Negative for activity change, appetite change, fatigue and fever.  HENT: Negative for mouth sores, nosebleeds and  sore throat.   Eyes: Negative for redness and visual disturbance.  Respiratory: Negative for cough, shortness of breath and wheezing.   Cardiovascular: Negative for chest pain, palpitations and leg swelling.  Gastrointestinal: Negative for abdominal pain, blood in stool, nausea and vomiting.  Endocrine: Negative for cold intolerance and heat intolerance.  Genitourinary: Negative for decreased urine volume, dysuria, genital sores, hematuria and testicular pain.  Musculoskeletal: Positive for arthralgias. Negative for gait problem and myalgias.  Skin: Negative for color change and rash.  Allergic/Immunologic: Negative for environmental allergies.  Neurological: Negative for syncope, weakness and headaches.  Hematological: Negative for adenopathy. Does not bruise/bleed easily.  Psychiatric/Behavioral: Positive for sleep disturbance. Negative for behavioral problems and confusion.   Current Outpatient Medications on File Prior to Visit  Medication Sig Dispense Refill  . colchicine 0.6 MG tablet Take 1 tablet (0.6 mg total) by mouth daily as needed. 30 tablet 2  . Multiple Vitamin (MULTIVITAMIN) tablet Take 1 tablet by mouth daily.    . sildenafil (REVATIO) 20 MG tablet Take 2 tablets (40 mg total) by mouth daily as needed. 60 tablet 3  . triamcinolone cream (KENALOG) 0.1 % Apply 1 application topically 2 (two) times daily as needed. Left forearm. 30 g 1   No current facility-administered medications on file prior to visit.   Past Medical History:  Diagnosis Date  . Arthritis   . Hyperlipidemia   . Hypertension     Past Surgical History:  Procedure Laterality Date  . APPENDECTOMY  age 68  . COLONOSCOPY    . TONSILLECTOMY     age 54    Allergies  Allergen Reactions  . Pravastatin Other (See Comments)    Joint pain    Family History  Problem Relation Age of Onset  . Cancer Mother        Glyoblastoma  . Cancer Father        Lung  . Diabetes Neg Hx   . Hyperlipidemia Neg  Hx   . Hypertension Neg Hx     Social History   Socioeconomic History  . Marital status: Divorced    Spouse name: Not on file  . Number of children: Not on file  . Years of education: Not on file  . Highest education level: Not on file  Occupational History  . Not on file  Tobacco Use  . Smoking status: Current Every Day Smoker    Packs/day: 0.50  . Smokeless tobacco: Never Used  Substance and Sexual Activity  . Alcohol use: Yes    Comment: almost daily  . Drug use: No  . Sexual activity: Not on file  Other Topics Concern  . Not on file  Social History Narrative  . Not on file   Social Determinants of Health   Financial Resource Strain:   . Difficulty of Paying Living Expenses: Not on file  Food Insecurity:   . Worried About Charity fundraiser in the Last Year: Not on file  . Ran Out of Food in the Last Year: Not on file  Transportation Needs:   . Lack of Transportation (Medical): Not on file  . Lack of Transportation (Non-Medical): Not on file  Physical Activity:   . Days of Exercise per Week: Not on file  . Minutes of Exercise per Session: Not on file  Stress:   . Feeling of Stress : Not on file  Social Connections:   . Frequency of Communication with Friends and Family: Not on file  . Frequency of Social Gatherings with Friends and Family: Not on file  . Attends Religious Services: Not on file  . Active Member of Clubs or Organizations: Not on file  . Attends Archivist Meetings: Not on file  . Marital Status: Not on file   Vitals:   09/23/20 0856  BP: 124/88  Pulse: 81  Resp: 16  Temp: 98.4 F (36.9 C)  SpO2: 95%   Body mass index is 33.02 kg/m.  Wt Readings from Last 3 Encounters:  09/23/20 223 lb 9.6 oz (101.4 kg)  10/09/19 223 lb 8 oz (101.4 kg)  10/04/18 226 lb (102.5 kg)   Physical Exam Vitals and nursing note reviewed.  Constitutional:      General: He is not in acute distress.    Appearance: He is well-developed.  HENT:      Head: Normocephalic and atraumatic.     Right Ear: Tympanic membrane, ear canal and external ear normal.     Left Ear: Tympanic membrane, ear canal and external ear normal.     Mouth/Throat:     Mouth: Mucous membranes are moist.     Pharynx: Oropharynx is clear.  Eyes:     Extraocular Movements: Extraocular movements intact.     Conjunctiva/sclera: Conjunctivae normal.     Pupils: Pupils are equal, round, and reactive to light.  Neck:     Thyroid: No thyromegaly.     Trachea: No tracheal deviation.  Cardiovascular:     Rate and Rhythm: Normal rate and  regular rhythm.     Pulses:          Dorsalis pedis pulses are 2+ on the right side and 2+ on the left side.     Heart sounds: No murmur heard.   Pulmonary:     Effort: Pulmonary effort is normal. No respiratory distress.     Breath sounds: Normal breath sounds.  Abdominal:     Palpations: Abdomen is soft. There is no hepatomegaly or mass.     Tenderness: There is no abdominal tenderness.  Genitourinary:    Comments: No concerns. Musculoskeletal:        General: No tenderness.     Cervical back: Normal range of motion.     Lumbar back: No tenderness or bony tenderness.     Right hip: No deformity, tenderness or bony tenderness. Normal range of motion.     Comments: No major deformities appreciated and no signs of synovitis.  Lymphadenopathy:     Cervical: No cervical adenopathy.     Upper Body:     Right upper body: No supraclavicular adenopathy.     Left upper body: No supraclavicular adenopathy.  Skin:    General: Skin is warm.     Findings: No erythema.  Neurological:     Mental Status: He is alert and oriented to person, place, and time.     Cranial Nerves: No cranial nerve deficit.     Sensory: No sensory deficit.     Coordination: Coordination normal.     Gait: Gait normal.     Deep Tendon Reflexes:     Reflex Scores:      Bicep reflexes are 2+ on the right side and 2+ on the left side.      Patellar reflexes  are 2+ on the right side and 2+ on the left side.   ASSESSMENT AND PLAN:  Mr.Isaiha was seen today for annual exam and follow-up.  Diagnoses and all orders for this visit: Orders Placed This Encounter  Procedures  . DG Hip Unilat W OR W/O Pelvis 1V Right  . Flu Vaccine QUAD High Dose(Fluad)  . Pneumococcal conjugate vaccine 13-valent  . COMPLETE METABOLIC PANEL WITH GFR  . Hemoglobin A1c  . Lipid panel  . Uric acid  . PSA  . Ambulatory Referral for Lung Cancer Scre  . Ambulatory referral to Physical Therapy  . VAS [place] AAA DUPLEX   Lab Results  Component Value Date   LABURIC 8.0 09/23/2020   Lab Results  Component Value Date   CHOL 213 (H) 09/23/2020   HDL 49 09/23/2020   LDLCALC 134 (H) 09/23/2020   TRIG 161 (H) 09/23/2020   CHOLHDL 4.3 09/23/2020   Lab Results  Component Value Date   HGBA1C 6.0 (H) 09/23/2020   Lab Results  Component Value Date   CREATININE 1.13 09/23/2020   BUN 13 09/23/2020   NA 140 09/23/2020   K 4.9 09/23/2020   CL 105 09/23/2020   CO2 25 09/23/2020   Lab Results  Component Value Date   ALT 37 09/23/2020   AST 25 09/23/2020   ALKPHOS 56 10/09/2019   BILITOT 0.7 09/23/2020   Lab Results  Component Value Date   PSA 2.24 09/23/2020   Routine general medical examination at a health care facility We discussed the importance of regular physical activity and healthy diet for prevention of chronic illness and/or complications. Preventive guidelines reviewed. Vaccination updated.  Next CPE in a year.  The 10-year ASCVD risk score Mikey Bussing DC  Brooke Bonito., et al., 2013) is: 17.5%   Values used to calculate the score:     Age: 29 years     Sex: Male     Is Non-Hispanic African American: No     Diabetic: No     Tobacco smoker: Yes     Systolic Blood Pressure: 423 mmHg     Is BP treated: No     HDL Cholesterol: 49 mg/dL     Total Cholesterol: 213 mg/dL  Hypertension, essential, benign BP adequately controlled. Continue non pharmacologic  treatment. Monitor BP at home.  Hyperlipidemia, unspecified hyperlipidemia type Continue non pharmacologic treatment. Further recommendations according to 10 years CVD risk score.  Right hip pain We discussed possible etiologies. Most likely OA. PT will be arranged. If not better after completing PT,ortho referral will be recommended.  Tobacco use disorder -     Ambulatory Referral for Lung Cancer Scre -     VAS [place] AAA DUPLEX; Future  Prostate cancer screening -     PSA  Screening for endocrine, metabolic and immunity disorder -     Hemoglobin A1c -     COMPLETE METABOLIC PANEL WITH GFR  Encounter for abdominal aortic aneurysm (AAA) screening -     VAS [place] AAA DUPLEX; Future  Gout, unspecified cause, unspecified chronicity, unspecified site Problem is stable. Continue Colchicine 0.6 mg daily as needed. Low purine diet to continue.  Need for influenza vaccination -     Flu Vaccine QUAD High Dose(Fluad)  Need for pneumococcal vaccination -     Pneumococcal conjugate vaccine 13-valent  Return in about 1 year (around 09/23/2021) for CPE, before if needed..   [place] G. Martinique, MD  [facility]. [place] office.  A few things to remember from today's visit:  Routine general medical examination at a health care facility  Hypertension, essential, benign  Hyperlipidemia, unspecified hyperlipidemia type - Plan: Lipid panel  Right hip pain - Plan: Ambulatory referral to Physical Therapy, DG Hip Unilat W OR W/O Pelvis 1V Right  Tobacco use disorder - Plan: Ambulatory Referral for Lung Cancer Scre, VAS [place] AAA DUPLEX  Prostate cancer screening - Plan: PSA  Screening for endocrine, metabolic and immunity disorder - Plan: COMPLETE METABOLIC PANEL WITH GFR, Hemoglobin A1c  Encounter for abdominal aortic aneurysm (AAA) screening - Plan: VAS [place] AAA DUPLEX  Gout, unspecified cause, unspecified chronicity, unspecified site - Plan: Uric acid  If you need refills  please call your pharmacy. Do not use My Chart to request refills or for acute issues that need immediate attention.    Please be sure medication list is accurate. If a new problem present, please set up appointment sooner than planned today.  At least 150 minutes of moderate exercise per week, daily brisk walking for 15-30 min is a good exercise option. Healthy diet low in saturated (animal) fats and sweets and consisting of fresh fruits and vegetables, lean meats such as fish and white chicken and whole grains.  - Vaccines:  Tdap vaccine every 10 years.  Shingles vaccine recommended at age 53, could be given after 65 years of age but not sure about insurance coverage.  Pneumonia vaccines: Pneumovax at 15  -Screening recommendations for low/normal risk males:  Screening for diabetes at age 9 and every 3 years. Earlier screening if cardiovascular risk factors.   Lipid screening at 35 and every 3 years. Screening starts in younger males with cardiovascular risk factors.N/A  Colon cancer screening is now at age 59 but  your insurance may not cover until age 52 .screening is recommended age 73.  Prostate cancer screening: some controversy, starts usually at 85: Rectal exam and PSA.  Aortic Abdominal Aneurism once between 68 and 37 years old if ever smoker.  Also recommended:  1. Dental visit- Brush and floss your teeth twice daily; visit your dentist twice a year. 2. Eye doctor- Get an eye exam at least every 2 years. 3. Helmet use- Always wear a helmet when riding a bicycle, motorcycle, rollerblading or skateboarding. 4. Safe sex- If you may be exposed to sexually transmitted infections, use a condom. 5. Seat belts- Seat belts can save your live; always wear one. 6. Smoke/Carbon Monoxide detectors- These detectors need to be installed on the appropriate level of your home. Replace batteries at least once a year. 7. Skin cancer- When out in the sun please cover up and use  sunscreen 15 SPF or higher. 8. Violence- If anyone is threatening or hurting you, please tell your healthcare provider.  9. Drink alcohol in moderation- Limit alcohol intake to one drink or less per day. Never drink and drive.

## 2020-09-24 LAB — COMPLETE METABOLIC PANEL WITH GFR
AG Ratio: 1.6 (calc) (ref 1.0–2.5)
ALT: 37 U/L (ref 9–46)
AST: 25 U/L (ref 10–35)
Albumin: 4.4 g/dL (ref 3.6–5.1)
Alkaline phosphatase (APISO): 55 U/L (ref 35–144)
BUN: 13 mg/dL (ref 7–25)
CO2: 25 mmol/L (ref 20–32)
Calcium: 10 mg/dL (ref 8.6–10.3)
Chloride: 105 mmol/L (ref 98–110)
Creat: 1.13 mg/dL (ref 0.70–1.25)
GFR, Est African American: 79 mL/min/{1.73_m2} (ref >=60)
GFR, Est Non African American: 68 mL/min/{1.73_m2} (ref >=60)
Globulin: 2.7 g/dL (calc) (ref 1.9–3.7)
Glucose, Bld: 115 mg/dL — ABNORMAL HIGH (ref 65–99)
Potassium: 4.9 mmol/L (ref 3.5–5.3)
Sodium: 140 mmol/L (ref 135–146)
Total Bilirubin: 0.7 mg/dL (ref 0.2–1.2)
Total Protein: 7.1 g/dL (ref 6.1–8.1)

## 2020-09-24 LAB — HEMOGLOBIN A1C
Hgb A1c MFr Bld: 6 % of total Hgb — ABNORMAL HIGH (ref <5.7)
Mean Plasma Glucose: 126 (calc)
eAG (mmol/L): 7 (calc)

## 2020-09-24 LAB — LIPID PANEL
Cholesterol: 213 mg/dL — ABNORMAL HIGH (ref <200)
HDL: 49 mg/dL (ref >=40)
LDL Cholesterol (Calc): 134 mg/dL (calc) — ABNORMAL HIGH
Non-HDL Cholesterol (Calc): 164 mg/dL (calc) — ABNORMAL HIGH (ref <130)
Total CHOL/HDL Ratio: 4.3 (calc) (ref <5.0)
Triglycerides: 161 mg/dL — ABNORMAL HIGH (ref <150)

## 2020-09-24 LAB — URIC ACID: Uric Acid, Serum: 8 mg/dL (ref 4.0–8.0)

## 2020-09-24 LAB — PSA: PSA: 2.24 ng/mL (ref <=4.0)

## 2020-09-30 ENCOUNTER — Encounter: Payer: Self-pay | Admitting: Family Medicine

## 2020-10-01 ENCOUNTER — Encounter: Payer: Self-pay | Admitting: Family Medicine

## 2020-10-07 ENCOUNTER — Other Ambulatory Visit: Payer: Self-pay

## 2020-10-07 ENCOUNTER — Encounter: Payer: Self-pay | Admitting: Physical Therapy

## 2020-10-07 ENCOUNTER — Ambulatory Visit: Payer: 59 | Attending: Family Medicine | Admitting: Physical Therapy

## 2020-10-07 DIAGNOSIS — M25651 Stiffness of right hip, not elsewhere classified: Secondary | ICD-10-CM | POA: Insufficient documentation

## 2020-10-07 DIAGNOSIS — M25551 Pain in right hip: Secondary | ICD-10-CM | POA: Diagnosis not present

## 2020-10-07 DIAGNOSIS — R293 Abnormal posture: Secondary | ICD-10-CM | POA: Insufficient documentation

## 2020-10-07 DIAGNOSIS — M6281 Muscle weakness (generalized): Secondary | ICD-10-CM | POA: Insufficient documentation

## 2020-10-07 NOTE — Therapy (Addendum)
[facility] [address], [place], [place], [address] Phone: [phone]   Fax:  [phone]  Physical Therapy Evaluation  Patient Details  Name: Patrick Cannon MRN: [id number] Date of Birth: 07-26-1956 Referring Provider (PT): Martinique, Betty G, MD   Encounter Date: 10/07/2020   PT End of Session - 10/07/20 1034    Visit Number 1    Date for PT Re-Evaluation 12/02/20    PT Start Time 1016    PT Stop Time 1050    PT Time Calculation (min) 34 min    Activity Tolerance Patient tolerated treatment well    Behavior During Therapy [facility] for tasks assessed/performed           Past Medical History:  Diagnosis Date  . Arthritis   . Hyperlipidemia   . Hypertension     Past Surgical History:  Procedure Laterality Date  . APPENDECTOMY     age 65  . COLONOSCOPY    . TONSILLECTOMY     age 69    There were no vitals filed for this visit.    Subjective Assessment - 10/07/20 1018    Subjective Pt states he has had hip pain for the last 6 months or so.  Pt has been using peloton for about a month and that seems to have helped.    Limitations Sitting    How long can you sit comfortably? 15-20 min on office chair or couch    Patient Stated Goals get hip feeling better;    Currently in Pain? 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    Multiple Pain Sites No              [facility] PT  Assessment - 10/09/20 0001      Assessment   Medical Diagnosis M25.551 (ICD-10-CM) - Right hip pain    Referring Provider (PT) Martinique, Betty G, MD    Onset Date/Surgical Date --   6 months   Prior Therapy No      Precautions   Precautions None      Restrictions   Weight Bearing Restrictions No      Balance Screen   Has the patient fallen in the past 6 months No      [place] residence    Living Arrangements Children   son     Prior Function   Level of Independence Independent    Vocation Full time employment    Vocation Requirements sitting; desk; usually can get up every 1.5 hours but sometimes cannot      Cognition   Overall Cognitive Status Within Functional Limits for tasks assessed      Observation/Other Assessments   Focus on Therapeutic Outcomes (FOTO)  46% limited  Posture/Postural Control   Posture/Postural Control Postural limitations    Postural Limitations Rounded Shoulders;Flexed trunk      PROM   Overall PROM Comments Rt hip flexion and ER 25% limited      Strength   Overall Strength Comments bilat hips 5/5; core 4/5      Flexibility   Soft Tissue Assessment /Muscle Length yes    Hamstrings 75%      Palpation   Palpation comment weak abdominal muscle tone; hamstrings and gluteals tight      Special Tests    Special Tests Hip Special Tests    Hip Special Tests  Saralyn Pilar (FABER) Test;Thomas Test   FADIR negative     Saralyn Pilar [facility]) Test   Findings Positive    Side Right      Thomas Test    Findings Positive    Side Right    Comments bilateral Rt>Lt      Ambulation/Gait   Gait Pattern Decreased stride length;Trunk flexed                      Objective measurements completed on examination: See above findings.       [facility] Adult PT Treatment/Exercise - 10/09/20 0001      Self-Care   Self-Care Other Self-Care Comments    Other Self-Care Comments  intial HEP educated and performed                    PT Education - 10/09/20 1306    Education Details Access Code: [email or web address]    Person(s) Educated Patient    Methods Explanation;Demonstration;Tactile cues;Verbal cues;Handout    Comprehension Verbalized understanding;Returned demonstration            PT Short Term Goals - 10/09/20 1326      PT SHORT TERM GOAL #1   Title pt will be ind with initial HEP    Time 4    Period Weeks    Status New    Target Date 11/04/20      PT SHORT TERM GOAL #2   Title Pt will demonstrate correct hip hinging technique    Time 4    Period Weeks    Status New    Target Date 11/04/20      PT SHORT TERM GOAL #3   Title Pt will report 25% less pain    Time 4    Period Weeks    Status New             PT Long Term Goals - 10/09/20 1327      PT LONG TERM GOAL #1   Title Pt will be in with advanced HEP for health maintenance    Time 8    Period Weeks    Status New    Target Date 12/02/20      PT LONG TERM GOAL #2   Title Pt will report FOTO 34% limited or better    Time 8    Period Weeks    Status New    Target Date 12/02/20      PT LONG TERM GOAL #3   Title Pt will report 75% less Rt hip pain    Time 8    Period Weeks    Status New    Target Date 12/02/20      PT LONG TERM GOAL #4   Title Pt will demonstrate good mechanics with squats x 10 reps and no pain    Time 8  Period Weeks    Status New    Target Date 12/02/20                  Plan - 10/09/20 1307    Clinical Impression Statement Pt presents to skilled PT due to Rt hip pain.  Pt has hip strength 5/5.  He tests positive for FABER, negative FADIR and scour tests.  Pt has decreased ROM Rt hip of flexion and ER.  Pt demonstrates posture deficits with increased rounding in thoracic and lumbar regions . Pt has tight h/s bilat with increased neural tension on Rt side.  Pt demonstrates core weakness.  He has poor squat technique as he rounds his back and shifts his weight into the Rt LE.   Pt's standing and walking posture are impaired due to hip flexor tension as he stands and walks with flexed trunk and decreased hip extension.  Pt sits a lot for work and has recently been working towards a less sedentary lifestyle to address other health issues.  Pt will benefit from skilled PT to address these impairments for pain management and optimize his ability to create a more healthy lifestyle    Personal Factors and Comorbidities Comorbidity 3+    Comorbidities hx: appendectomy age 53; HTN, Arthritis, obesity    Examination-Activity Limitations Stand;Sit    Examination-Participation Restrictions Occupation;Community Activity    Stability/Clinical Decision Making Stable/Uncomplicated    Clinical Decision Making Moderate    Rehab Potential Excellent    PT Frequency 1x / week    PT Duration 8 weeks    PT Treatment/Interventions ADLs/Self Care Home Management;Biofeedback;Cryotherapy;Electrical Stimulation;Moist Heat;Traction;Dry needling;Manual techniques;Patient/family education;Therapeutic exercise;Neuromuscular re-education;Therapeutic activities;Gait training;Taping    PT Next Visit Plan review HEP, nerve glides, posture education    PT Home Exercise Plan Access Code: [email or web address]    Consulted and Agree with Plan of Care Patient           Patient will benefit from skilled therapeutic intervention in order to improve the following deficits and impairments:  Abnormal gait, Decreased strength, Impaired flexibility, Pain, Postural dysfunction  Visit Diagnosis: Pain in right hip  Stiffness of right hip, not elsewhere classified  Abnormal posture  Muscle weakness (generalized)     Problem List Patient Active Problem List   Diagnosis Date Noted  . ED (erectile dysfunction) 10/04/2018  . Gout, arthropathy 10/04/2018  . Hyperlipidemia 09/27/2017  . Hypertension, essential, benign 05/31/2017  . Generalized osteoarthritis of hand 05/31/2017  . Tobacco use disorder 05/31/2017  .  Class 1 obesity with body mass index (BMI) of 33.0 to 33.9 in adult 05/31/2017    Camillo Flaming Juni Glaab, PT 10/09/2020, 1:41 PM  [facility] [address] [place], [place], [address] Phone: [phone]   Fax:  [phone]  Name: Patrick Cannon MRN: [id number] Date of Birth: December 20, 1955

## 2020-10-09 ENCOUNTER — Other Ambulatory Visit (HOSPITAL_COMMUNITY): Payer: 59

## 2020-10-09 NOTE — Patient Instructions (Signed)
Access Code: [email or web address] URL: [email or web address] Date: 10/09/2020 Prepared by: Jari Favre  Exercises Clamshell - 1 x daily - 7 x weekly - 3 sets - 10 reps Beginner Reverse Clamshell - 1 x daily - 7 x weekly - 3 sets - 10 reps Sidelying Hip Abduction - 1 x daily - 7 x weekly - 3 sets - 10 reps Supine Single Knee to Chest Stretch - 2 x daily - 7 x weekly - 5 reps - 1 sets - 10 sec hold Supine Figure 4 Piriformis Stretch - 1 x daily - 7 x weekly - 3 reps - 1 sets - 30 sec hold

## 2020-10-09 NOTE — Addendum Note (Signed)
Addended by: Su Hoff on: 10/09/2020 01:44 PM   Modules accepted: Orders

## 2020-10-15 ENCOUNTER — Other Ambulatory Visit: Payer: Self-pay

## 2020-10-15 ENCOUNTER — Ambulatory Visit: Payer: 59

## 2020-10-15 DIAGNOSIS — M25651 Stiffness of right hip, not elsewhere classified: Secondary | ICD-10-CM

## 2020-10-15 DIAGNOSIS — R293 Abnormal posture: Secondary | ICD-10-CM

## 2020-10-15 DIAGNOSIS — M6281 Muscle weakness (generalized): Secondary | ICD-10-CM

## 2020-10-15 DIAGNOSIS — M25551 Pain in right hip: Secondary | ICD-10-CM | POA: Diagnosis not present

## 2020-10-15 NOTE — Patient Instructions (Signed)
Access Code: [email or web address] URL: [email or web address] Date: 10/15/2020 Prepared by: Claiborne Billings  Seated Hamstring Stretch - 2 x daily - 7 x weekly - 1 sets - 3 reps - 20 hold Seated Figure 4 Piriformis Stretch - 2 x daily - 7 x weekly - 1 sets - 3 reps - 20 hold

## 2020-10-15 NOTE — Therapy (Signed)
[facility] [address], [place], [place], [address] Phone: [phone]   Fax:  [phone]  Physical Therapy Treatment  Patient Details  Name: Patrick Cannon MRN: [id number] Date of Birth: September 13, 1956 Referring Provider (PT): Martinique, Betty G, MD   Encounter Date: 10/15/2020   PT End of Session - 10/15/20 0844    Visit Number 2    Date for PT Re-Evaluation 12/02/20    PT Start Time 0801    PT Stop Time 0845    PT Time Calculation (min) 44 min    Activity Tolerance Patient tolerated treatment well    Behavior During Therapy [facility] for tasks assessed/performed           Past Medical History:  Diagnosis Date  . Arthritis   . Hyperlipidemia   . Hypertension     Past Surgical History:  Procedure Laterality Date  . APPENDECTOMY     age 64  . COLONOSCOPY    . TONSILLECTOMY     age 31    There were no vitals filed for this visit.   Subjective Assessment - 10/15/20 0803    Subjective I've been doing my exercises.  The stretches are a challenge.    Currently in Pain? Yes    Pain Score 1     Pain Location Hip    Pain Orientation Right    Pain Descriptors / Indicators Nagging    Pain Type Chronic pain    Pain Onset More than a month ago    Pain Frequency Intermittent    Aggravating Factors  sitting on a soft chair, end of the day    Pain Relieving Factors inversion table, change of position                             [facility] Adult PT Treatment/Exercise - 10/15/20 0001      Exercises   Exercises Knee/Hip      Knee/Hip Exercises: Stretches   Active Hamstring Stretch 3 reps;20 seconds    Piriformis Stretch Right;Left;2 reps;30 seconds    Piriformis Stretch Limitations 2 positions, supine and seated.    Other Knee/Hip Stretches SKTC 2x30 sec bil      Knee/Hip Exercises: Supine   Bridges Right;1 set;10 reps      Knee/Hip Exercises: Sidelying   Clams 2x10 bil      Manual Therapy    Manual Therapy Myofascial release    Manual therapy comments Addaday to Rt gluteals                  PT Education - 10/15/20 0849    Education Details Access Code: [email or web address]    Person(s) Educated Patient    Methods Explanation;Demonstration;Handout    Comprehension Verbalized understanding;Returned demonstration            PT Short Term Goals - 10/09/20 1326      PT SHORT TERM GOAL #1   Title pt will be ind with initial HEP    Time 4    Period Weeks    Status New    Target Date 11/04/20      PT SHORT TERM GOAL #2   Title Pt will demonstrate correct hip hinging technique    Time 4    Period Weeks    Status New    Target Date 11/04/20      PT SHORT TERM GOAL #3   Title Pt will report 25%  less pain    Time 4    Period Weeks    Status New             PT Long Term Goals - 10/09/20 1327      PT LONG TERM GOAL #1   Title Pt will be in with advanced HEP for health maintenance    Time 8    Period Weeks    Status New    Target Date 12/02/20      PT LONG TERM GOAL #2   Title Pt will report FOTO 34% limited or better    Time 8    Period Weeks    Status New    Target Date 12/02/20      PT LONG TERM GOAL #3   Title Pt will report 75% less Rt hip pain    Time 8    Period Weeks    Status New    Target Date 12/02/20      PT LONG TERM GOAL #4   Title Pt will demonstrate good mechanics with squats x 10 reps and no pain    Time 8    Period Weeks    Status New    Target Date 12/02/20                 Plan - 10/15/20 0847    Clinical Impression Statement Pt with first time follow-up after evaluation.  Pt is independent and compliant in HEP for flexibility and gluteal strength.  Pt required minor verbal cues for alignment and technique.  PT added 2 additional stretches to be performed in sitting and can do during the work day.  Pt had good response to Addaday mobilization of the Rt gluteals.  Pt will continue to benefit from skilled PT to address Rt  gluteal/leg pain.    PT Frequency 1x / week    PT Duration 8 weeks    PT Treatment/Interventions ADLs/Self Care Home Management;Biofeedback;Cryotherapy;Electrical Stimulation;Moist Heat;Traction;Dry needling;Manual techniques;Patient/family education;Therapeutic exercise;Neuromuscular re-education;Therapeutic activities;Gait training;Taping    PT Next Visit Plan review new HEP, nerve glides, posture education, manual to Rt gluteals    PT Home Exercise Plan Access Code: [email or web address]    Consulted and Agree with Plan of Care Patient           Patient will benefit from skilled therapeutic intervention in order to improve the following deficits and impairments:  Abnormal gait,Decreased strength,Impaired flexibility,Pain,Postural dysfunction  Visit Diagnosis: Stiffness of right hip, not elsewhere classified  Pain in right hip  Abnormal posture  Muscle weakness (generalized)     Problem List Patient Active Problem List   Diagnosis Date Noted  . ED (erectile dysfunction) 10/04/2018  . Gout, arthropathy 10/04/2018  . Hyperlipidemia 09/27/2017  . Hypertension, essential, benign 05/31/2017  . Generalized osteoarthritis of hand 05/31/2017  . Tobacco use disorder 05/31/2017  . Class 1 obesity with body mass index (BMI) of 33.0 to 33.9 in adult 05/31/2017     Sigurd Sos, PT 10/15/20 8:49 AM  [facility] [address] [place], [place], [address] Phone: [phone]   Fax:  [phone]  Name: Patrick Cannon MRN: [id number] Date of Birth: 1955-12-09

## 2020-10-17 ENCOUNTER — Other Ambulatory Visit: Payer: Self-pay

## 2020-10-17 ENCOUNTER — Ambulatory Visit (HOSPITAL_COMMUNITY)
Admission: RE | Admit: 2020-10-17 | Discharge: 2020-10-17 | Disposition: A | Discharge status: Home / Self Care | Payer: 59 | Source: Ambulatory Visit | Attending: Cardiology | Admitting: Cardiology

## 2020-10-17 DIAGNOSIS — Z136 Encounter for screening for cardiovascular disorders: Secondary | ICD-10-CM | POA: Diagnosis present

## 2020-10-17 DIAGNOSIS — F17200 Nicotine dependence, unspecified, uncomplicated: Secondary | ICD-10-CM

## 2020-10-22 ENCOUNTER — Ambulatory Visit: Payer: 59

## 2020-10-22 ENCOUNTER — Other Ambulatory Visit: Payer: Self-pay

## 2020-10-22 DIAGNOSIS — M25551 Pain in right hip: Secondary | ICD-10-CM

## 2020-10-22 DIAGNOSIS — R293 Abnormal posture: Secondary | ICD-10-CM

## 2020-10-22 DIAGNOSIS — M25651 Stiffness of right hip, not elsewhere classified: Secondary | ICD-10-CM

## 2020-10-22 DIAGNOSIS — M6281 Muscle weakness (generalized): Secondary | ICD-10-CM

## 2020-10-22 NOTE — Therapy (Signed)
[facility] [address] [place], [place], [address] Phone: [phone]   Fax:  [phone]  Physical Therapy Treatment  Patient Details  Name: Patrick Cannon MRN: [id number] Date of Birth: 1956-09-12 Referring Provider (PT): Martinique, Betty G, MD   Encounter Date: 10/22/2020   PT End of Session - 10/22/20 0851    Visit Number 3    Date for PT Re-Evaluation 12/02/20    PT Start Time 0801    PT Stop Time 0845    PT Time Calculation (min) 44 min    Activity Tolerance Patient tolerated treatment well    Behavior During Therapy [facility] for tasks assessed/performed           Past Medical History:  Diagnosis Date  . Arthritis   . Hyperlipidemia   . Hypertension     Past Surgical History:  Procedure Laterality Date  . APPENDECTOMY     age 36  . COLONOSCOPY    . TONSILLECTOMY     age 23    There were no vitals filed for this visit.   Subjective Assessment - 10/22/20 0802    Subjective I loved the Addaday.  It made a difference that day.    Currently in Pain? Yes    Pain Score 1     Pain Location Hip    Pain Orientation Right    Pain Descriptors / Indicators Nagging    Pain Type Chronic pain    Pain Onset More than a month ago    Pain Frequency Intermittent    Aggravating Factors  end of the day (5-6/10)    Pain Relieving Factors inversion table, change of position, early in the day                             [facility] Adult PT Treatment/Exercise - 10/22/20 0001      Knee/Hip Exercises: Stretches   Active Hamstring Stretch 3 reps;20 seconds    Piriformis Stretch Right;Left;2 reps;30 seconds    Piriformis Stretch Limitations supine    Other Knee/Hip Stretches SKTC 2x30 sec bil    Other Knee/Hip Stretches low trunk rotation 3x20 seconds      Knee/Hip Exercises: Supine   Bridges Right;1 set;10 reps    Other Supine Knee/Hip Exercises hip abduction blue loop single leg 2x10 each       Manual Therapy   Manual Therapy Myofascial release    Manual therapy comments Addaday to Rt gluteals                    PT Short Term Goals - 10/09/20 1326      PT SHORT TERM GOAL #1   Title pt will be ind with initial HEP    Time 4    Period Weeks    Status New    Target Date 11/04/20      PT SHORT TERM GOAL #2   Title Pt will demonstrate correct hip hinging technique    Time 4    Period Weeks    Status New    Target Date 11/04/20      PT SHORT TERM GOAL #3   Title Pt will report 25% less pain    Time 4    Period Weeks    Status New             PT Long Term Goals - 10/09/20 1327  PT LONG TERM GOAL #1   Title Pt will be in with advanced HEP for health maintenance    Time 8    Period Weeks    Status New    Target Date 12/02/20      PT LONG TERM GOAL #2   Title Pt will report FOTO 34% limited or better    Time 8    Period Weeks    Status New    Target Date 12/02/20      PT LONG TERM GOAL #3   Title Pt will report 75% less Rt hip pain    Time 8    Period Weeks    Status New    Target Date 12/02/20      PT LONG TERM GOAL #4   Title Pt will demonstrate good mechanics with squats x 10 reps and no pain    Time 8    Period Weeks    Status New    Target Date 12/02/20                 Plan - 10/22/20 0383    Clinical Impression Statement Pt reports a positive response from [place] last visit and had relief of symptoms that day.  He is looking into purchasing one for home. Pt has been stretching regularly in multiple positions to address hip flexibility. Pt  tolerated advancement of exercises in the clinic without limitation and some slight Rt lumbar gluteal strain with eccentric motion.   Pt had good response to Addaday mobilization of the Rt gluteals.  Pt will continue to benefit from skilled PT to address Rt gluteal/leg pain.    Rehab Potential Excellent    PT Frequency 1x / week    PT Duration 8 weeks    PT Treatment/Interventions  ADLs/Self Care Home Management;Biofeedback;Cryotherapy;Electrical Stimulation;Moist Heat;Traction;Dry needling;Manual techniques;Patient/family education;Therapeutic exercise;Neuromuscular re-education;Therapeutic activities;Gait training;Taping    PT Next Visit Plan add more hip stablization to HEP, posture education, manual to Rt gluteals    PT Home Exercise Plan Access Code: [email or web address]    Consulted and Agree with Plan of Care Patient           Patient will benefit from skilled therapeutic intervention in order to improve the following deficits and impairments:  Abnormal gait,Decreased strength,Impaired flexibility,Pain,Postural dysfunction  Visit Diagnosis: Stiffness of right hip, not elsewhere classified  Pain in right hip  Abnormal posture  Muscle weakness (generalized)     Problem List Patient Active Problem List   Diagnosis Date Noted  . ED (erectile dysfunction) 10/04/2018  . Gout, arthropathy 10/04/2018  . Hyperlipidemia 09/27/2017  . Hypertension, essential, benign 05/31/2017  . Generalized osteoarthritis of hand 05/31/2017  . Tobacco use disorder 05/31/2017  . Class 1 obesity with body mass index (BMI) of 33.0 to 33.9 in adult 05/31/2017     Sigurd Sos, PT 10/22/20 8:54 AM  [facility] [address] [place], [place], [address] Phone: [phone]   Fax:  [phone]  Name: Patrick Cannon MRN: [id number] Date of Birth: 1956-11-01

## 2020-10-29 ENCOUNTER — Other Ambulatory Visit: Payer: Self-pay

## 2020-10-29 ENCOUNTER — Ambulatory Visit: Payer: 59

## 2020-10-29 DIAGNOSIS — M6281 Muscle weakness (generalized): Secondary | ICD-10-CM

## 2020-10-29 DIAGNOSIS — M25651 Stiffness of right hip, not elsewhere classified: Secondary | ICD-10-CM

## 2020-10-29 DIAGNOSIS — R293 Abnormal posture: Secondary | ICD-10-CM

## 2020-10-29 DIAGNOSIS — M25551 Pain in right hip: Secondary | ICD-10-CM | POA: Diagnosis not present

## 2020-10-29 NOTE — Therapy (Signed)
[facility] [address] [place], [place], [address] Phone: [phone]   Fax:  [phone]  Physical Therapy Treatment  Patient Details  Name: Patrick Cannon MRN: [id number] Date of Birth: 02-Aug-1956 Referring Provider (PT): [place], Betty G, MD   Encounter Date: 10/29/2020   PT End of Session - 10/29/20 0843    Visit Number 4    Date for PT Re-Evaluation 12/02/20    PT Start Time 0800    PT Stop Time 0842    PT Time Calculation (min) 42 min    Activity Tolerance Patient tolerated treatment well    Behavior During Therapy [facility] for tasks assessed/performed           Past Medical History:  Diagnosis Date  . Arthritis   . Hyperlipidemia   . Hypertension     Past Surgical History:  Procedure Laterality Date  . APPENDECTOMY     age 64  . COLONOSCOPY    . TONSILLECTOMY     age 52    There were no vitals filed for this visit.   Subjective Assessment - 10/29/20 0805    Subjective I am still having pain but it goes away much faster now.  50% overall reduction in frequency and length of pain.    Currently in Pain? Yes    Pain Score 6     Pain Location Hip    Pain Orientation Right    Pain Descriptors / Indicators Nagging    Pain Type Chronic pain    Pain Onset More than a month ago    Pain Frequency Intermittent    Aggravating Factors  end of the day (5-6/10), not stretching    Pain Relieving Factors inversion table, change of position, stretching                             [facility] Adult PT Treatment/Exercise - 10/29/20 0001      Knee/Hip Exercises: Stretches   Active Hamstring Stretch 3 reps;20 seconds    Active Hamstring Stretch Limitations supine with strap    Piriformis Stretch Right;Left;2 reps;30 seconds    Piriformis Stretch Limitations supine    Other Knee/Hip Stretches SKTC 2x30 sec bil      Knee/Hip Exercises: Supine   Bridges Right;1 set;10 reps      Knee/Hip  Exercises: Sidelying   Clams 2x10 bil- yellow band      Manual Therapy   Manual Therapy Myofascial release    Manual therapy comments Addaday to Rt gluteals                    PT Short Term Goals - 10/29/20 0806      PT SHORT TERM GOAL #1   Title pt will be ind with initial HEP    Status Achieved      PT SHORT TERM GOAL #2   Title Pt will demonstrate correct hip hinging technique    Status On-going      PT SHORT TERM GOAL #3   Title Pt will report 25% less pain    Status Achieved             PT Long Term Goals - 10/09/20 1327      PT LONG TERM GOAL #1   Title Pt will be in with advanced HEP for health maintenance    Time 8    Period Weeks    Status New  Target Date 12/02/20      PT LONG TERM GOAL #2   Title Pt will report FOTO 34% limited or better    Time 8    Period Weeks    Status New    Target Date 12/02/20      PT LONG TERM GOAL #3   Title Pt will report 75% less Rt hip pain    Time 8    Period Weeks    Status New    Target Date 12/02/20      PT LONG TERM GOAL #4   Title Pt will demonstrate good mechanics with squats x 10 reps and no pain    Time 8    Period Weeks    Status New    Target Date 12/02/20                 Plan - 10/29/20 0820    Clinical Impression Statement Pt reports a 50% reduction in the frequency and duration of his Rt hip pain.  No change in intensity overall.  Pt reports 50% consistency with HEP. Pt with reduced hip flexibility on the Rt vs the Lt with stretching exercises today. Pt required tactile and verbal cues for technique with exercise today.  Pt responds well to Addaday for tissue mobilization and is looking to get a similar device for home.   Pt will continue to benefit from skilled PT to address Rt gluteal/hip pain.    PT Frequency 1x / week    PT Duration 8 weeks    PT Treatment/Interventions ADLs/Self Care Home Management;Biofeedback;Cryotherapy;Electrical Stimulation;Moist Heat;Traction;Dry  needling;Manual techniques;Patient/family education;Therapeutic exercise;Neuromuscular re-education;Therapeutic activities;Gait training;Taping    PT Next Visit Plan hip flexibility and strength, manual to Rt gluteals    PT Home Exercise Plan Access Code: [email or web address]    Recommended Other Services initial cert is signed    Consulted and Agree with Plan of Care Patient           Patient will benefit from skilled therapeutic intervention in order to improve the following deficits and impairments:  Abnormal gait,Decreased strength,Impaired flexibility,Pain,Postural dysfunction  Visit Diagnosis: Stiffness of right hip, not elsewhere classified  Pain in right hip  Abnormal posture  Muscle weakness (generalized)     Problem List Patient Active Problem List   Diagnosis Date Noted  . ED (erectile dysfunction) 10/04/2018  . Gout, arthropathy 10/04/2018  . Hyperlipidemia 09/27/2017  . Hypertension, essential, benign 05/31/2017  . Generalized osteoarthritis of hand 05/31/2017  . Tobacco use disorder 05/31/2017  . Class 1 obesity with body mass index (BMI) of 33.0 to 33.9 in adult 05/31/2017    Patrick Cannon, PT 10/29/20 8:44 AM  [facility] [address] [place], [place], [address] Phone: [phone]   Fax:  [phone]  Name: Patrick Cannon MRN: [id number] Date of Birth: 07-19-1956

## 2020-11-05 ENCOUNTER — Encounter: Payer: Self-pay | Admitting: Physical Therapy

## 2020-11-05 ENCOUNTER — Ambulatory Visit: Payer: 59 | Attending: Family Medicine | Admitting: Physical Therapy

## 2020-11-05 ENCOUNTER — Other Ambulatory Visit: Payer: Self-pay | Admitting: Family Medicine

## 2020-11-05 ENCOUNTER — Other Ambulatory Visit: Payer: Self-pay

## 2020-11-05 DIAGNOSIS — M25551 Pain in right hip: Secondary | ICD-10-CM | POA: Diagnosis present

## 2020-11-05 DIAGNOSIS — M6281 Muscle weakness (generalized): Secondary | ICD-10-CM | POA: Insufficient documentation

## 2020-11-05 DIAGNOSIS — R293 Abnormal posture: Secondary | ICD-10-CM | POA: Insufficient documentation

## 2020-11-05 DIAGNOSIS — M25651 Stiffness of right hip, not elsewhere classified: Secondary | ICD-10-CM | POA: Diagnosis present

## 2020-11-05 DIAGNOSIS — N529 Male erectile dysfunction, unspecified: Secondary | ICD-10-CM

## 2020-11-05 NOTE — Therapy (Signed)
[facility] [address] [place], [place], [address] Phone: [phone]   Fax:  [phone]  Physical Therapy Treatment  Patient Details  Name: Patrick Cannon MRN: [id number] Date of Birth: 1956/07/17 Referring Provider (PT): [place], Betty G, MD   Encounter Date: 11/05/2020   PT End of Session - 11/05/20 0759    Visit Number 5    Date for PT Re-Evaluation 12/02/20    PT Start Time 0759    PT Stop Time 0840    PT Time Calculation (min) 41 min    Activity Tolerance Patient tolerated treatment well    Behavior During Therapy [facility] for tasks assessed/performed           Past Medical History:  Diagnosis Date  . Arthritis   . Hyperlipidemia   . Hypertension     Past Surgical History:  Procedure Laterality Date  . APPENDECTOMY     age 65  . COLONOSCOPY    . TONSILLECTOMY     age 43    There were no vitals filed for this visit.   Subjective Assessment - 11/05/20 0803    Subjective I am feeling good.  I have pain sometimes in the lower back and it is just related to work and sitting in the chair.  Pt states he flared up after coming back to work but was fine when he was off last week.    Patient Stated Goals get hip feeling better;    Currently in Pain? No/denies    Multiple Pain Sites No                             [facility] Adult PT Treatment/Exercise - 11/05/20 0001      Self-Care   Self-Care Other Self-Care Comments    Other Self-Care Comments  office sitting posture; lacross ball against the wall for trigger point self massage      Knee/Hip Exercises: Stretches   Active Hamstring Stretch 3 reps;20 seconds    Active Hamstring Stretch Limitations standing at steps    Hip Flexor Stretch Both;2 reps;30 seconds    Other Knee/Hip Stretches wall lumbar and thoracic ext - 6 x 5 sec      Knee/Hip Exercises: Aerobic   Nustep L3 x 6 min PT present for assessment of status      Knee/Hip  Exercises: Standing   Hip Abduction Stengthening;Both;10 reps;Knee straight   green loop   Hip Extension Stengthening;Both;10 reps;Knee straight   green loop   Other Standing Knee Exercises squat with cued for more trunk extension - 15x    Other Standing Knee Exercises hip hinge 15x      Manual Therapy   Manual therapy comments trigger point release to Rt gluteal                    PT Short Term Goals - 10/29/20 0806      PT SHORT TERM GOAL #1   Title pt will be ind with initial HEP    Status Achieved      PT SHORT TERM GOAL #2   Title Pt will demonstrate correct hip hinging technique    Status On-going      PT SHORT TERM GOAL #3   Title Pt will report 25% less pain    Status Achieved             PT Long Term Goals - 11/05/20  0806      PT LONG TERM GOAL #1   Title Pt will be in with advanced HEP for health maintenance    Status On-going      PT LONG TERM GOAL #2   Title Pt will report FOTO 34% limited or better    Status On-going      PT LONG TERM GOAL #3   Title Pt will report 75% less Rt hip pain    Baseline at least 50% less pain    Status On-going      PT LONG TERM GOAL #4   Title Pt will demonstrate good mechanics with squats x 10 reps and no pain    Baseline able to do squat with good mechanic - can "feel it" in the Rt hip/glute    Status Partially Met                 Plan - 11/05/20 0849    Clinical Impression Statement Pt states pain is 50% or more better.  Pt states the stretches and exercises help and he got a massage gun for use at home.  Pt is unsure on whether he will be able to maintain gains made in PT on his own.  He also is expected to make more progress based on newly added functional strengthening today.  Pt has one more visit to return after vacation and work related travel.  PT will re-assess pt status at that time.  Pt does well with hip hinge and squats but limited ROM due to hamstring tension and glute weakness.  Pt will  benefit from skilled PT to continue to work on core and hip strength for improved functional strength without pain.    PT Treatment/Interventions ADLs/Self Care Home Management;Biofeedback;Cryotherapy;Electrical Stimulation;Moist Heat;Traction;Dry needling;Manual techniques;Patient/family education;Therapeutic exercise;Neuromuscular re-education;Therapeutic activities;Gait training;Taping    PT Next Visit Plan FOTO; re-eval; f/u on squats and hip hinge; core strength; hip and lumbar ext; f/u on office chair if needed    PT Home Exercise Plan Access Code: [email or web address]    Consulted and Agree with Plan of Care Patient           Patient will benefit from skilled therapeutic intervention in order to improve the following deficits and impairments:  Abnormal gait,Decreased strength,Impaired flexibility,Pain,Postural dysfunction  Visit Diagnosis: Stiffness of right hip, not elsewhere classified  Pain in right hip  Abnormal posture  Muscle weakness (generalized)     Problem List Patient Active Problem List   Diagnosis Date Noted  . ED (erectile dysfunction) 10/04/2018  . Gout, arthropathy 10/04/2018  . Hyperlipidemia 09/27/2017  . Hypertension, essential, benign 05/31/2017  . Generalized osteoarthritis of hand 05/31/2017  . Tobacco use disorder 05/31/2017  . Class 1 obesity with body mass index (BMI) of 33.0 to 33.9 in adult 05/31/2017    Camillo Flaming Scorpio Fortin, PT 11/05/2020, 9:05 AM  [facility] [address], [place], [place], [address] Phone: [phone]   Fax:  [phone]  Name: CAMDIN HEGNER MRN: [id number] Date of Birth: Sep 21, 1956

## 2020-11-05 NOTE — Therapy (Addendum)
[facility] [address] [place], [place], [address] Phone: [phone]   Fax:  [phone]  Physical Therapy Treatment  Patient Details  Name: Patrick Cannon MRN: [id number] Date of Birth: 05/01/1956 Referring Provider (PT): Martinique, Betty G, MD   Encounter Date: 11/05/2020   PT End of Session - 11/05/20 0759    Visit Number 5    Date for PT Re-Evaluation 12/02/20    PT Start Time 0759    PT Stop Time 0840    PT Time Calculation (min) 41 min    Activity Tolerance Patient tolerated treatment well    Behavior During Therapy [facility] for tasks assessed/performed           Past Medical History:  Diagnosis Date  . Arthritis   . Hyperlipidemia   . Hypertension     Past Surgical History:  Procedure Laterality Date  . APPENDECTOMY     age 65  . COLONOSCOPY    . TONSILLECTOMY     age 65    There were no vitals filed for this visit.   Subjective Assessment - 11/05/20 0803    Subjective I am feeling good.  I have pain sometimes in the lower back and it is just related to work and sitting in the chair.  Pt states he flared up after coming back to work but was fine when he was off last week.    Patient Stated Goals get hip feeling better;    Currently in Pain? No/denies    Multiple Pain Sites No                             [facility] Adult PT Treatment/Exercise - 11/05/20 0001      Self-Care   Self-Care Other Self-Care Comments    Other Self-Care Comments  office sitting posture; lacross ball against the wall for trigger point self massage      Knee/Hip Exercises: Stretches   Active Hamstring Stretch 3 reps;20 seconds    Active Hamstring Stretch Limitations standing at steps    Hip Flexor Stretch Both;2 reps;30 seconds    Other Knee/Hip Stretches wall lumbar and thoracic ext - 6 x 5 sec      Knee/Hip Exercises: Aerobic   Nustep L3 x 6 min PT present for assessment of status      Knee/Hip  Exercises: Standing   Hip Abduction Stengthening;Both;10 reps;Knee straight   green loop   Hip Extension Stengthening;Both;10 reps;Knee straight   green loop   Other Standing Knee Exercises squat with cued for more trunk extension - 15x    Other Standing Knee Exercises hip hinge 15x      Manual Therapy   Manual therapy comments trigger point release to Rt gluteal                  PT Education - 11/05/20 0909    Education Details Access Code: [email or web address]    Person(s) Educated Patient    Methods Explanation;Demonstration;Tactile cues;Verbal cues;Handout    Comprehension Verbalized understanding;Returned demonstration            PT Short Term Goals - 10/29/20 0806      PT SHORT TERM GOAL #1   Title pt will be ind with initial HEP    Status Achieved      PT SHORT TERM GOAL #2   Title Pt will demonstrate correct hip hinging technique    Status On-going  PT SHORT TERM GOAL #3   Title Pt will report 25% less pain    Status Achieved             PT Long Term Goals - 11/05/20 0806      PT LONG TERM GOAL #1   Title Pt will be in with advanced HEP for health maintenance    Status On-going      PT LONG TERM GOAL #2   Title Pt will report FOTO 34% limited or better    Status On-going      PT LONG TERM GOAL #3   Title Pt will report 75% less Rt hip pain    Baseline at least 50% less pain    Status On-going      PT LONG TERM GOAL #4   Title Pt will demonstrate good mechanics with squats x 10 reps and no pain    Baseline able to do squat with good mechanic - can "feel it" in the Rt hip/glute    Status Partially Met                 Plan - 11/05/20 0849    Clinical Impression Statement Pt states pain is 50% or more better.  Pt states the stretches and exercises help and he got a massage gun for use at home.  Pt is unsure on whether he will be able to maintain gains made in PT on his own.  He also is expected to make more progress based on newly added  functional strengthening today.  Pt has one more visit to return after vacation and work related travel.  PT will re-assess pt status at that time.  Pt does well with hip hinge and squats but limited ROM due to hamstring tension and glute weakness.  Pt will benefit from skilled PT to continue to work on core and hip strength for improved functional strength without pain.    PT Treatment/Interventions ADLs/Self Care Home Management;Biofeedback;Cryotherapy;Electrical Stimulation;Moist Heat;Traction;Dry needling;Manual techniques;Patient/family education;Therapeutic exercise;Neuromuscular re-education;Therapeutic activities;Gait training;Taping    PT Next Visit Plan FOTO; re-eval; f/u on squats and hip hinge; core strength; hip and lumbar ext; f/u on office chair if needed    PT Home Exercise Plan Access Code: [email or web address]    Consulted and Agree with Plan of Care Patient           Patient will benefit from skilled therapeutic intervention in order to improve the following deficits and impairments:  Abnormal gait,Decreased strength,Impaired flexibility,Pain,Postural dysfunction  Visit Diagnosis: Stiffness of right hip, not elsewhere classified  Pain in right hip  Abnormal posture  Muscle weakness (generalized)     Problem List Patient Active Problem List   Diagnosis Date Noted  . ED (erectile dysfunction) 10/04/2018  . Gout, arthropathy 10/04/2018  . Hyperlipidemia 09/27/2017  . Hypertension, essential, benign 05/31/2017  . Generalized osteoarthritis of hand 05/31/2017  . Tobacco use disorder 05/31/2017  . Class 1 obesity with body mass index (BMI) of 33.0 to 33.9 in adult 05/31/2017    Camillo Flaming Marline Morace, PT 11/05/2020, 9:09 AM PHYSICAL THERAPY DISCHARGE SUMMARY  Visits from Start of Care: 5  Current functional level related to goals / functional outcomes: Pt called to cancel all appts as he is doing well.     Remaining deficits: See above for current PT status.     Education /  Equipment: HEP Plan: Patient agrees to discharge.  Patient goals were met. Patient is being discharged due to meeting the stated rehab goals.  ?????  Sigurd Sos, PT 11/25/20 3:46 PM  [facility] [address] [place], [place], [address] Phone: [phone]   Fax:  [phone]  Name: KASTEN LEVEQUE MRN: [id number] Date of Birth: May 17, 1956

## 2020-11-05 NOTE — Patient Instructions (Signed)
Access Code: [email or web address] URL: [email or web address] Date: 11/05/2020 Prepared by: Dwana Curd  Exercises Clamshell - 1 x daily - 7 x weekly - 3 sets - 10 reps Beginner Reverse Clamshell - 1 x daily - 7 x weekly - 3 sets - 10 reps Sidelying Hip Abduction - 1 x daily - 7 x weekly - 3 sets - 10 reps Supine Single Knee to Chest Stretch - 2 x daily - 7 x weekly - 5 reps - 1 sets - 10 sec hold Supine Figure 4 Piriformis Stretch - 1 x daily - 7 x weekly - 3 reps - 1 sets - 30 sec hold Seated Hamstring Stretch - 2 x daily - 7 x weekly - 1 sets - 3 reps - 20 hold Seated Figure 4 Piriformis Stretch - 2 x daily - 7 x weekly - 1 sets - 3 reps - 20 hold Half Dead Lift with Kettlebell - 1 x daily - 7 x weekly - 2 sets - 10 reps Squat - 1 x daily - 7 x weekly - 2 sets - 10 reps Standing Lumbar Extension at Wall - Forearms - 1 x daily - 7 x weekly - 3 sets - 10 reps  Patient Education Office Posture

## 2020-11-07 NOTE — Telephone Encounter (Signed)
Last office visit-09/23/2020 Last refill- 10-09-19

## 2020-11-26 ENCOUNTER — Ambulatory Visit: Payer: Self-pay

## 2020-12-12 ENCOUNTER — Other Ambulatory Visit: Payer: Self-pay | Admitting: Family Medicine

## 2020-12-12 DIAGNOSIS — N529 Male erectile dysfunction, unspecified: Secondary | ICD-10-CM

## 2022-01-06 NOTE — Progress Notes (Signed)
HPI: [name] is a 66 y.o.male here today for his routine physical examination and follow up.  Last CPE: 09/23/20  Regular exercise:Lifting wts and peloton bike 2-3 times per week for 45 min. Following a healthful diet: Decreased red meat to 2 times per weeks, vegetables with every dinner. Oatmeal with cranberries every morning.  Chronic medical problems: Tobacco use disorder,HTN,HLD, ED,prediabetes, and OA.   Immunization History  Administered Date(s) Administered   Fluad Quad(high Dose 65+) 09/23/2020   Influenza,inj,Quad PF,6+ Mos 09/28/2017, 10/09/2019   PFIZER(Purple Top)SARS-COV-2 Vaccination 10/07/2020   Pneumococcal Conjugate-13 09/23/2020   Pneumococcal Polysaccharide-23 01/07/2022    Health Maintenance  Topic Date Due   COVID-19 Vaccine (2 - Pfizer series) 01/22/2022 (Originally 10/28/2020)   INFLUENZA VACCINE  01/30/2022 (Originally 06/02/2021)   TETANUS/TDAP  11/06/2022 (Originally 06/16/2021)   Zoster Vaccines- Shingrix (1 of 2) 11/06/2022 (Originally 04/27/2005)   COLONOSCOPY (Pts 45-56yr Insurance coverage will need to be confirmed)  02/17/2026   Pneumonia Vaccine 68 Years old  Completed   Hepatitis C Screening  Completed   HPV VACCINES  Aged Out   Last colonoscopy polypectomy, pathology:Tubular adenoma  Last prostate ca screening:   PSA 2.24 09/23/2020   -2 beers daily. +Tobacco use. Tried Chantix, did not help.  -Concerns and/or follow up today:  Prediabetes: Negative for polydipsia,polyuria, or polyphagia.  Lab Results  Component Value Date   HGBA1C 6.0 (H) 09/23/2020   Hypertension: He is currently on nonpharmacologic treatment.  Lab Results  Component Value Date   CREATININE 1.13 09/23/2020   BUN 13 09/23/2020   NA 140 09/23/2020   K 4.9 09/23/2020   CL 105 09/23/2020   CO2 25 09/23/2020   HLD: He is on nonpharmacologic treatment. Lab Results  Component Value Date   CHOL 213 (H) 09/23/2020   HDL 49 09/23/2020   LDLCALC 134 (H)  09/23/2020   TRIG 161 (H) 09/23/2020   CHOLHDL 4.3 09/23/2020   Last gout attack 2-3 years ago. Currently he is on colchicine 0.6 mg twice daily as needed.  Lab Results  Component Value Date   LABURIC 8.0 09/23/2020   ED: He is on Sildenafil 20 mg 2 tabs daily prn. Medication still helps, no side effects reported. Requesting STD testing. No known exposure.  Review of Systems  Constitutional:  Negative for activity change, appetite change and fever.  HENT:  Negative for mouth sores, nosebleeds, sore throat, trouble swallowing and voice change.   Eyes:  Negative for redness and visual disturbance.  Respiratory:  Negative for cough, shortness of breath and wheezing.   Cardiovascular:  Negative for chest pain, palpitations and leg swelling.  Gastrointestinal:  Negative for abdominal pain, blood in stool, nausea and vomiting.  Endocrine: Negative for cold intolerance and heat intolerance.  Genitourinary:  Negative for decreased urine volume, dysuria, genital sores, hematuria, penile discharge and testicular pain.  Musculoskeletal:  Positive for arthralgias (Right shoulder and right knee mainly.). Negative for gait problem and myalgias.  Skin:  Negative for color change and rash.  Allergic/Immunologic: Negative for environmental allergies.  Neurological:  Negative for syncope, weakness and headaches.  Hematological:  Negative for adenopathy. Does not bruise/bleed easily.  Psychiatric/Behavioral:  Negative for confusion. The patient is not nervous/anxious.    Current Outpatient Medications on File Prior to Visit  Medication Sig Dispense Refill   Multiple Vitamin (MULTIVITAMIN) tablet Take 1 tablet by mouth daily.     triamcinolone cream (KENALOG) 0.1 % Apply 1 application topically 2 (two) times daily  as needed. Left forearm. 30 g 1   No current facility-administered medications on file prior to visit.   Past Medical History:  Diagnosis Date   Arthritis    Hyperlipidemia     Hypertension    Past Surgical History:  Procedure Laterality Date   APPENDECTOMY     age 20   COLONOSCOPY     TONSILLECTOMY     age 66    Allergies  Allergen Reactions   Pravastatin Other (See Comments)    Joint pain    Family History  Problem Relation Age of Onset   Cancer Mother        Glyoblastoma   Cancer Father        Lung   Diabetes Neg Hx    Hyperlipidemia Neg Hx    Hypertension Neg Hx     Social History   Socioeconomic History   Marital status: Divorced    Spouse name: Not on file   Number of children: Not on file   Years of education: Not on file   Highest education level: Not on file  Occupational History   Not on file  Tobacco Use   Smoking status: Every Day    Packs/day: 0.50    Years: 52.00    Pack years: 26.00    Types: Cigarettes    Start date: 04/27/1970   Smokeless tobacco: Never  Substance and Sexual Activity   Alcohol use: Yes    Comment: almost daily   Drug use: No   Sexual activity: Not on file  Other Topics Concern   Not on file  Social History Narrative   Not on file   Social Determinants of Health   Financial Resource Strain: Not on file  Food Insecurity: Not on file  Transportation Needs: Not on file  Physical Activity: Not on file  Stress: Not on file  Social Connections: Not on file   Vitals:   01/07/22 1021  BP: 128/80  Pulse: 87  Resp: 16  SpO2: 97%   Body mass index is 33.37 kg/m. Wt Readings from Last 3 Encounters:  01/07/22 226 lb (102.5 kg)  09/23/20 223 lb 9.6 oz (101.4 kg)  10/09/19 223 lb 8 oz (101.4 kg)   Physical Exam Vitals and nursing note reviewed.  Constitutional:      General: He is not in acute distress.    Appearance: He is well-developed.  HENT:     Head: Normocephalic and atraumatic.     Right Ear: Tympanic membrane, ear canal and external ear normal.     Left Ear: Tympanic membrane, ear canal and external ear normal.  Eyes:     Conjunctiva/sclera: Conjunctivae normal.     Pupils:  Pupils are equal, round, and reactive to light.  Neck:     Thyroid: No thyromegaly.     Trachea: No tracheal deviation.  Cardiovascular:     Rate and Rhythm: Normal rate and regular rhythm.     Pulses:          Dorsalis pedis pulses are 2+ on the right side and 2+ on the left side.     Heart sounds: No murmur heard. Pulmonary:     Effort: Pulmonary effort is normal. No respiratory distress.     Breath sounds: Normal breath sounds.  Abdominal:     Palpations: Abdomen is soft. There is no hepatomegaly or mass.     Tenderness: There is no abdominal tenderness.  Genitourinary:    Comments: No concerns. Musculoskeletal:  General: No tenderness.     Cervical back: Normal range of motion.     Comments: No signs of synovitis.  Lymphadenopathy:     Cervical: No cervical adenopathy.     Upper Body:     Right upper body: No supraclavicular adenopathy.     Left upper body: No supraclavicular adenopathy.  Skin:    General: Skin is warm.     Findings: No erythema.  Neurological:     Mental Status: He is alert and oriented to person, place, and time.     Cranial Nerves: No cranial nerve deficit.     Sensory: No sensory deficit.     Coordination: Coordination normal.     Gait: Gait normal.     Deep Tendon Reflexes:     Reflex Scores:      Bicep reflexes are 2+ on the right side and 2+ on the left side.      Patellar reflexes are 2+ on the right side and 2+ on the left side.  ASSESSMENT AND PLAN:  Patrick Cannon was seen today for annual exam and follow-up.  Diagnoses and all orders for this visit: Orders Placed This Encounter  Procedures   Pneumococcal polysaccharide vaccine 23-valent greater than or equal to 2yo subcutaneous/IM   PSA   Hemoglobin A1c   Lipid panel   CMP   Uric Acid   HIV Antibody (routine testing w rflx)   RPR   Ambulatory referral to Gastroenterology   Ambulatory Referral for Lung Cancer Scre   Lab Results  Component Value Date   CREATININE 1.07  01/07/2022   BUN 13 01/07/2022   NA 138 01/07/2022   K 4.2 01/07/2022   CL 103 01/07/2022   CO2 26 01/07/2022   Lab Results  Component Value Date   ALT 29 01/07/2022   AST 28 01/07/2022   ALKPHOS 58 01/07/2022   BILITOT 1.0 01/07/2022   Lab Results  Component Value Date   CHOL 241 (H) 01/07/2022   HDL 51.70 01/07/2022   LDLCALC 154 (H) 01/07/2022   TRIG 178.0 (H) 01/07/2022   CHOLHDL 5 01/07/2022   Lab Results  Component Value Date   HGBA1C 6.2 01/07/2022   Lab Results  Component Value Date   PSA 2.00 01/07/2022   Lab Results  Component Value Date   LABURIC 8.3 (H) 01/07/2022   Routine general medical examination at a health care facility We discussed the importance of regular physical activity and healthy diet for prevention of chronic illness and/or complications. Preventive guidelines reviewed. Vaccination updated.  GI referral placed to have colonoscopy repeated. Next CPE in a year. The 10-year ASCVD risk score (Arnett DK, et al., 2019) is: 20.2%   Values used to calculate the score:     Age: 71 years     Sex: Male     Is Non-Hispanic African American: No     Diabetic: No     Tobacco smoker: Yes     Systolic Blood Pressure: 939 mmHg     Is BP treated: No     HDL Cholesterol: 51.7 mg/dL     Total Cholesterol: 241 mg/dL  Prostate cancer screening -     PSA  Colon cancer screening -     Ambulatory referral to Gastroenterology  Need for pneumococcal vaccination -     Pneumococcal polysaccharide vaccine 23-valent greater than or equal to 2yo subcutaneous/IM  Screen for STD (sexually transmitted disease) -     RPR -  Urine cytology ancillary only -     HIV Antibody (routine testing w rflx)  Hypertension, essential, benign BP adequately controlled. Continue non pharmacologic treatment. Low salt diet.  Hyperlipidemia Non pharmacologic treatment recommended for now. Further recommendations will be given according to 10 years CVD risk score and  lipid panel numbers.  ED (erectile dysfunction) Stable. Continue Sildenafil 20 mg 2 tabs daily as needed.  Gout, arthropathy He has been asymptomatic for about 2 to 3 years. Continue colchicine 0.6 mg twice daily as needed. Low purine diet to continue. Further recommendations according to uric acid result.  Class 1 obesity with body mass index (BMI) of 33.0 to 33.9 in adult About 3 Lb wt gain since his last visit. We discussed benefits of wt loss. Consistency with healthy diet and physical activity encouraged.   Osteoarthritis, multiple sites We discussed diagnosis, prognosis, and treatment options. He would like to try acupuncture treatment, contact information of providers here in [place] given, so he can call and arrange appointment.  Prediabetes Consistency with a healthy lifestyle encouraged for diabetes prevention. Further recommendation will be given according to hemoglobin A1c result.  Screening for lung cancer - Ambulatory Referral for Lung Cancer Scre  Return in 1 year (on 01/08/2023) for cpe and f/u.  Lamarcus Spira G. Martinique, MD  [facility]. [place] office.

## 2022-01-07 ENCOUNTER — Encounter: Payer: Self-pay | Admitting: Family Medicine

## 2022-01-07 ENCOUNTER — Other Ambulatory Visit (HOSPITAL_COMMUNITY)
Admission: RE | Admit: 2022-01-07 | Discharge: 2022-01-07 | Disposition: A | Discharge status: Home / Self Care | Payer: Medicare HMO | Source: Ambulatory Visit | Attending: Family Medicine | Admitting: Family Medicine

## 2022-01-07 ENCOUNTER — Ambulatory Visit (INDEPENDENT_AMBULATORY_CARE_PROVIDER_SITE_OTHER): Payer: Medicare HMO | Admitting: Family Medicine

## 2022-01-07 VITALS — BP 128/80 | HR 87 | Resp 16 | Ht 69.0 in | Wt 226.0 lb

## 2022-01-07 DIAGNOSIS — N529 Male erectile dysfunction, unspecified: Secondary | ICD-10-CM

## 2022-01-07 DIAGNOSIS — Z0000 Encounter for general adult medical examination without abnormal findings: Secondary | ICD-10-CM

## 2022-01-07 DIAGNOSIS — I10 Essential (primary) hypertension: Secondary | ICD-10-CM | POA: Diagnosis not present

## 2022-01-07 DIAGNOSIS — R7303 Prediabetes: Secondary | ICD-10-CM

## 2022-01-07 DIAGNOSIS — Z113 Encounter for screening for infections with a predominantly sexual mode of transmission: Secondary | ICD-10-CM

## 2022-01-07 DIAGNOSIS — M159 Polyosteoarthritis, unspecified: Secondary | ICD-10-CM

## 2022-01-07 DIAGNOSIS — Z122 Encounter for screening for malignant neoplasm of respiratory organs: Secondary | ICD-10-CM

## 2022-01-07 DIAGNOSIS — Z125 Encounter for screening for malignant neoplasm of prostate: Secondary | ICD-10-CM | POA: Diagnosis not present

## 2022-01-07 DIAGNOSIS — E785 Hyperlipidemia, unspecified: Secondary | ICD-10-CM | POA: Diagnosis not present

## 2022-01-07 DIAGNOSIS — Z1211 Encounter for screening for malignant neoplasm of colon: Secondary | ICD-10-CM

## 2022-01-07 DIAGNOSIS — F17200 Nicotine dependence, unspecified, uncomplicated: Secondary | ICD-10-CM | POA: Diagnosis not present

## 2022-01-07 DIAGNOSIS — E669 Obesity, unspecified: Secondary | ICD-10-CM | POA: Diagnosis not present

## 2022-01-07 DIAGNOSIS — M109 Gout, unspecified: Secondary | ICD-10-CM | POA: Diagnosis not present

## 2022-01-07 DIAGNOSIS — Z6833 Body mass index (BMI) 33.0-33.9, adult: Secondary | ICD-10-CM

## 2022-01-07 DIAGNOSIS — Z23 Encounter for immunization: Secondary | ICD-10-CM

## 2022-01-07 DIAGNOSIS — R69 Illness, unspecified: Secondary | ICD-10-CM | POA: Diagnosis not present

## 2022-01-07 DIAGNOSIS — Z130 Encounter for screening for diseases of the blood and blood-forming organs and certain disorders involving the immune mechanism: Secondary | ICD-10-CM

## 2022-01-07 LAB — LIPID PANEL
Cholesterol: 241 mg/dL — ABNORMAL HIGH (ref 0–200)
HDL: 51.7 mg/dL (ref >39.00)
LDL Cholesterol: 154 mg/dL — ABNORMAL HIGH (ref 0–99)
NonHDL: 189.6
Total CHOL/HDL Ratio: 5
Triglycerides: 178 mg/dL — ABNORMAL HIGH (ref 0.0–149.0)
VLDL: 35.6 mg/dL (ref 0.0–40.0)

## 2022-01-07 LAB — PSA: PSA: 2 ng/mL (ref 0.10–4.00)

## 2022-01-07 LAB — HEMOGLOBIN A1C: Hgb A1c MFr Bld: 6.2 % (ref 4.6–6.5)

## 2022-01-07 LAB — COMPREHENSIVE METABOLIC PANEL
ALT: 29 U/L (ref 0–53)
AST: 28 U/L (ref 0–37)
Albumin: 4.5 g/dL (ref 3.5–5.2)
Alkaline Phosphatase: 58 U/L (ref 39–117)
BUN: 13 mg/dL (ref 6–23)
CO2: 26 mEq/L (ref 19–32)
Calcium: 9.9 mg/dL (ref 8.4–10.5)
Chloride: 103 mEq/L (ref 96–112)
Creatinine, Ser: 1.07 mg/dL (ref 0.40–1.50)
GFR: 72.22 mL/min (ref >60.00)
Glucose, Bld: 104 mg/dL — ABNORMAL HIGH (ref 70–99)
Potassium: 4.2 mEq/L (ref 3.5–5.1)
Sodium: 138 mEq/L (ref 135–145)
Total Bilirubin: 1 mg/dL (ref 0.2–1.2)
Total Protein: 7.4 g/dL (ref 6.0–8.3)

## 2022-01-07 LAB — URIC ACID: Uric Acid, Serum: 8.3 mg/dL — ABNORMAL HIGH (ref 4.0–7.8)

## 2022-01-07 MED ORDER — SILDENAFIL CITRATE 20 MG PO TABS: ORAL_TABLET | ORAL | 1 refills | Status: DC

## 2022-01-07 MED ORDER — COLCHICINE 0.6 MG PO TABS: 0.6000 mg | ORAL_TABLET | Freq: Every day | ORAL | 2 refills | Status: AC | PRN

## 2022-01-07 NOTE — Assessment & Plan Note (Signed)
BP adequately controlled. Continue nonpharmacologic treatment. Low-salt diet. 

## 2022-01-07 NOTE — Patient Instructions (Addendum)
A few things to remember from today's visit: ? ? ?Routine general medical examination at a health care facility ? ?Hyperlipidemia, unspecified hyperlipidemia type - Plan: Lipid panel, CMP ? ?Hypertension, essential, benign ? ?Prostate cancer screening - Plan: PSA ? ?Gout, unspecified cause, unspecified chronicity, unspecified site - Plan: Uric Acid ? ?Prediabetes - Plan: Hemoglobin A1c ? ?Tobacco use disorder ? ?Gout, arthropathy - Plan: colchicine 0.6 MG tablet ? ?Erectile dysfunction, unspecified erectile dysfunction type - Plan: sildenafil (REVATIO) 20 MG tablet ? ?Colon cancer screening - Plan: Ambulatory referral to Gastroenterology ? ?If you need refills please call your pharmacy. ?Do not use My Chart to request refills or for acute issues that need immediate attention. ? ?Please be sure medication list is accurate. ?If a new problem present, please set up appointment sooner than planned today. ?Preventive Care 26 Years and Older, Male ?Preventive care refers to lifestyle choices and visits with your health care provider that can promote health and wellness. Preventive care visits are also called wellness exams. ?What can I expect for my preventive care visit? ?Counseling ?During your preventive care visit, your health care provider may ask about your: ?Medical history, including: ?Past medical problems. ?Family medical history. ?History of falls. ?Current health, including: ?Emotional well-being. ?Home life and relationship well-being. ?Sexual activity. ?Memory and ability to understand (cognition). ?Lifestyle, including: ?Alcohol, nicotine or tobacco, and drug use. ?Access to firearms. ?Diet, exercise, and sleep habits. ?Work and work Statistician. ?Sunscreen use. ?Safety issues such as seatbelt and bike helmet use. ?Physical exam ?Your health care provider will check your: ?Height and weight. These may be used to calculate your BMI (body mass index). BMI is a measurement that tells if you are at a healthy  weight. ?Waist circumference. This measures the distance around your waistline. This measurement also tells if you are at a healthy weight and may help predict your risk of certain diseases, such as type 2 diabetes and high blood pressure. ?Heart rate and blood pressure. ?Body temperature. ?Skin for abnormal spots. ?What immunizations do I need? ?Vaccines are usually given at various ages, according to a schedule. Your health care provider will recommend vaccines for you based on your age, medical history, and lifestyle or other factors, such as travel or where you work. ?What tests do I need? ?Screening ?Your health care provider may recommend screening tests for certain conditions. This may include: ?Lipid and cholesterol levels. ?Diabetes screening. This is done by checking your blood sugar (glucose) after you have not eaten for a while (fasting). ?Hepatitis C test. ?Hepatitis B test. ?HIV (human immunodeficiency virus) test. ?STI (sexually transmitted infection) testing, if you are at risk. ?Lung cancer screening. ?Colorectal cancer screening. ?Prostate cancer screening. ?Abdominal aortic aneurysm (AAA) screening. You may need this if you are a current or former smoker. ?Talk with your health care provider about your test results, treatment options, and if necessary, the need for more tests. ?Follow these instructions at home: ?Eating and drinking ? ?Eat a diet that includes fresh fruits and vegetables, whole grains, lean protein, and low-fat dairy products. Limit your intake of foods with high amounts of sugar, saturated fats, and salt. ?Take vitamin and mineral supplements as recommended by your health care provider. ?Do not drink alcohol if your health care provider tells you not to drink. ?If you drink alcohol: ?Limit how much you have to 0-2 drinks a day. ?Know how much alcohol is in your drink. In the [place] one drink equals one 12 oz bottle of beer (  355 mL), one 5 oz glass of wine (148 mL), or one 1? oz  glass of hard liquor (44 mL). ?Lifestyle ?Brush your teeth every morning and night with fluoride toothpaste. Floss one time each day. ?Exercise for at least 30 minutes 5 or more days each week. ?Do not use any products that contain nicotine or tobacco. These products include cigarettes, chewing tobacco, and vaping devices, such as e-cigarettes. If you need help quitting, ask your health care provider. ?Do not use drugs. ?If you are sexually active, practice safe sex. Use a condom or other form of protection to prevent STIs. ?Take aspirin only as told by your health care provider. Make sure that you understand how much to take and what form to take. Work with your health care provider to find out whether it is safe and beneficial for you to take aspirin daily. ?Ask your health care provider if you need to take a cholesterol-lowering medicine (statin). ?Find healthy ways to manage stress, such as: ?Meditation, yoga, or listening to music. ?Journaling. ?Talking to a trusted person. ?Spending time with friends and family. ?Safety ?Always wear your seat belt while driving or riding in a vehicle. ?Do not drive: ?If you have been drinking alcohol. Do not ride with someone who has been drinking. ?When you are tired or distracted. ?While texting. ?If you have been using any mind-altering substances or drugs. ?Wear a helmet and other protective equipment during sports activities. ?If you have firearms in your house, make sure you follow all gun safety procedures. ?Minimize exposure to UV radiation to reduce your risk of skin cancer. ?What's next? ?Visit your health care provider once a year for an annual wellness visit. ?Ask your health care provider how often you should have your eyes and teeth checked. ?Stay up to date on all vaccines. ?This information is not intended to replace advice given to you by your health care provider. Make sure you discuss any questions you have with your health care provider. ?Document Revised:  04/16/2021 Document Reviewed: 04/16/2021 ?Elsevier Patient Education ? 2022 [place]. ? ? ? ? ? ? ? ?

## 2022-01-07 NOTE — Assessment & Plan Note (Signed)
About 3 Lb wt gain since his last visit. ?We discussed benefits of wt loss. ?Consistency with healthy diet and physical activity encouraged. ? ?

## 2022-01-07 NOTE — Assessment & Plan Note (Signed)
Consistency with a healthy lifestyle encouraged for diabetes prevention. ?Further recommendation will be given according to hemoglobin A1c result. ?

## 2022-01-07 NOTE — Assessment & Plan Note (Signed)
We discussed diagnosis, prognosis, and treatment options. ?He would like to try acupuncture treatment, contact information of providers here in [place] given, so he can call and arrange appointment. ?

## 2022-01-07 NOTE — Assessment & Plan Note (Signed)
Stable. ?Continue Sildenafil 20 mg 2 tabs daily as needed. ?

## 2022-01-07 NOTE — Assessment & Plan Note (Signed)
Non pharmacologic treatment recommended for now. Further recommendations will be given according to 10 years CVD risk score and lipid panel numbers. 

## 2022-01-07 NOTE — Assessment & Plan Note (Signed)
He has been asymptomatic for about 2 to 3 years. ?Continue colchicine 0.6 mg twice daily as needed. ?Low purine diet to continue. ?Further recommendations according to uric acid result. ?

## 2022-01-08 LAB — URINE CYTOLOGY ANCILLARY ONLY
Chlamydia: NEGATIVE
Comment: NEGATIVE
Comment: NEGATIVE
Comment: NORMAL
Neisseria Gonorrhea: NEGATIVE
Trichomonas: NEGATIVE

## 2022-01-08 LAB — HIV ANTIBODY (ROUTINE TESTING W REFLEX): HIV 1&2 Ab, 4th Generation: NONREACTIVE

## 2022-01-08 LAB — RPR: RPR Ser Ql: NONREACTIVE

## 2022-01-10 ENCOUNTER — Encounter: Payer: Self-pay | Admitting: Family Medicine

## 2022-01-21 ENCOUNTER — Other Ambulatory Visit: Payer: Self-pay | Admitting: Family Medicine

## 2022-01-21 DIAGNOSIS — N529 Male erectile dysfunction, unspecified: Secondary | ICD-10-CM

## 2022-01-23 ENCOUNTER — Telehealth: Payer: Self-pay | Admitting: Internal Medicine

## 2022-01-23 NOTE — Telephone Encounter (Signed)
Hi Dr. Henrene Pastor, ? ? ?We received a referral for patient to have a colonoscopy he had one done with [facility] in 2017 and the reports are available in [place]. Spoke with patient and he said he changed all his providers to be under [facility]. ? ?Please advise on scheduling. ? ?Thank you ? ?

## 2022-01-27 NOTE — Telephone Encounter (Signed)
GI COURTESY RECORDS REVIEW ? ?The patient had colonoscopy in 2017 with Dr. Penelope Coop [facility]).  He was found to have multiple (2) and advanced (10 mm) adenomas.  He is due for routine surveillance colonoscopy. ?Okay to schedule routine previsit followed by direct colonoscopy in [place] with Dr. Henrene Pastor.  Reason "history of colon polyps". ? ?Thank you, ? ? ?Dr. Henrene Pastor ?

## 2022-01-27 NOTE — Telephone Encounter (Signed)
Called patient to advise and schedule left voicemail. 

## 2022-01-28 ENCOUNTER — Encounter: Payer: Self-pay | Admitting: Internal Medicine

## 2022-01-28 NOTE — Telephone Encounter (Signed)
Scheduled PV 4/5 LEC 5/4 ?

## 2022-02-04 ENCOUNTER — Ambulatory Visit (AMBULATORY_SURGERY_CENTER): Payer: Medicare HMO | Admitting: *Deleted

## 2022-02-04 ENCOUNTER — Other Ambulatory Visit: Payer: Self-pay

## 2022-02-04 VITALS — Ht 69.0 in | Wt 220.0 lb

## 2022-02-04 DIAGNOSIS — Z86010 Personal history of colonic polyps: Secondary | ICD-10-CM

## 2022-02-04 MED ORDER — PLENVU 140 G PO SOLR: 1.0000 | Freq: Once | ORAL | 0 refills | Status: AC

## 2022-02-04 NOTE — Progress Notes (Signed)
Pt's previsit is done over the phone and all paperwork (prep instructions, blank consent form to just read over) sent to patient.  Pt's name and DOB verified at the beginning of the previsit.   ?Pt denies any difficulty with ambulating.  ? ? ?No trouble with anesthesia, denies being told they were difficult to intubate or trouble moving neck, or hx/fam hx of malignant hyperthermia per pt ? ? ? ?No egg or soy allergy ? ?No home oxygen use  ? ?No medications for weight loss taken ? ?emmi information given ? ?Pt denies constipation issues ? ?Pt informed that we do not do prior authorizations for prep ? ?[employer] coupon mailed to pt with paperwork ?

## 2022-02-19 DIAGNOSIS — H524 Presbyopia: Secondary | ICD-10-CM | POA: Diagnosis not present

## 2022-02-24 ENCOUNTER — Encounter: Payer: Self-pay | Admitting: Internal Medicine

## 2022-03-02 ENCOUNTER — Other Ambulatory Visit: Payer: Self-pay | Admitting: *Deleted

## 2022-03-02 DIAGNOSIS — Z87891 Personal history of nicotine dependence: Secondary | ICD-10-CM

## 2022-03-02 DIAGNOSIS — Z122 Encounter for screening for malignant neoplasm of respiratory organs: Secondary | ICD-10-CM

## 2022-03-02 DIAGNOSIS — F17210 Nicotine dependence, cigarettes, uncomplicated: Secondary | ICD-10-CM

## 2022-03-05 ENCOUNTER — Ambulatory Visit (AMBULATORY_SURGERY_CENTER): Payer: Medicare HMO | Admitting: Internal Medicine

## 2022-03-05 ENCOUNTER — Encounter: Payer: Self-pay | Admitting: Internal Medicine

## 2022-03-05 VITALS — BP 109/66 | HR 67 | Temp 96.6°F | Resp 12 | Ht 69.0 in | Wt 220.0 lb

## 2022-03-05 DIAGNOSIS — D124 Benign neoplasm of descending colon: Secondary | ICD-10-CM | POA: Diagnosis not present

## 2022-03-05 DIAGNOSIS — D120 Benign neoplasm of cecum: Secondary | ICD-10-CM | POA: Diagnosis not present

## 2022-03-05 DIAGNOSIS — Z86010 Personal history of colonic polyps: Secondary | ICD-10-CM

## 2022-03-05 DIAGNOSIS — K635 Polyp of colon: Secondary | ICD-10-CM | POA: Diagnosis not present

## 2022-03-05 DIAGNOSIS — D123 Benign neoplasm of transverse colon: Secondary | ICD-10-CM | POA: Diagnosis not present

## 2022-03-05 MED ORDER — SODIUM CHLORIDE 0.9 % IV SOLN: 500.0000 mL | Freq: Once | INTRAVENOUS | Status: DC

## 2022-03-05 NOTE — Patient Instructions (Signed)

## 2022-03-05 NOTE — Progress Notes (Signed)
HISTORY OF PRESENT ILLNESS: ? ?Patrick Cannon is a 67 y.o. male who presents today for surveillance colonoscopy.  Review of prior outside records shows that the patient underwent colonoscopy with Dr. Penelope Coop in 2017.  He was found to have 2 tubular adenomas 1 of which was 10 mm.  He is now for surveillance. ? ?REVIEW OF SYSTEMS: ? ?All non-GI ROS negative. ? ?Past Medical History:  ?Diagnosis Date  ? Arthritis   ? Hyperlipidemia   ? no meds  ? Hypertension   ? no meds  ? ? ?Past Surgical History:  ?Procedure Laterality Date  ? APPENDECTOMY    ? age 12  ? COLONOSCOPY    ? TONSILLECTOMY    ? age 7  ? ? ?Social History ?Patrick Cannon  reports that he has been smoking cigarettes. He started smoking about 51 years ago. He has a 26.00 pack-year smoking history. He has never used smokeless tobacco. He reports current alcohol use. He reports that he does not use drugs. ? ?family history includes Cancer in his father and mother; Colon cancer in his maternal aunt. ? ?Allergies  ?Allergen Reactions  ? Pravastatin Other (See Comments)  ?  Joint pain  ? ? ?  ? ?PHYSICAL EXAMINATION: ? ?Vital signs: BP (!) 145/82   Pulse 77   Temp (!) 96.6 ?F (35.9 ?C)   Ht '5\' 9"'$  (1.753 m)   Wt 220 lb (99.8 kg)   SpO2 96%   BMI 32.49 kg/m?  ?General: Well-developed, well-nourished, no acute distress ?HEENT: Sclerae are anicteric, conjunctiva pink. Oral mucosa intact ?Lungs: Clear ?Heart: Regular ?Abdomen: soft, nontender, nondistended, no obvious ascites, no peritoneal signs, normal bowel sounds. No organomegaly. ?Extremities: No edema ?Psychiatric: alert and oriented x3. Cooperative  ? ? ? ?ASSESSMENT: ? ?Patient with a history of multiple and advanced adenomatous colon polyps.  Presents for surveillance colonoscopy.  No contraindication.  Appropriate candidate ? ? ?PLAN: ? ? ?Surveillance colonoscopy ? ? ? ?  ?

## 2022-03-05 NOTE — Op Note (Signed)
[place] ?Patient Name: Patrick Cannon ?Procedure Date: 03/05/2022 1:47 PM ?MRN: [id number] ?Endoscopist: Docia Chuck. Henrene Pastor , MD ?Age: 66 ?Referring MD:  ?Date of Birth: 09/17/1956 ?Gender: Male ?Account #: [id number] ?Procedure:                Colonoscopy with cold snare polypectomy x4; with  ?                          biopsy polypectomy x 1 ?Indications:              High risk colon cancer surveillance: Personal  ?                          history of adenoma (10 mm or greater in size), High  ?                          risk colon cancer surveillance: Personal history of  ?                          multiple (3 or more) adenomas. Previous  ?                          examinations elsewhere. Most recently 2017 Penelope Coop) ?Medicines:                Monitored Anesthesia Care ?Procedure:                Pre-Anesthesia Assessment: ?                          - Prior to the procedure, a History and Physical  ?                          was performed, and patient medications and  ?                          allergies were reviewed. The patient's tolerance of  ?                          previous anesthesia was also reviewed. The risks  ?                          and benefits of the procedure and the sedation  ?                          options and risks were discussed with the patient.  ?                          All questions were answered, and informed consent  ?                          was obtained. Prior Anticoagulants: The patient has  ?                          taken no previous anticoagulant or antiplatelet  ?  agents. ASA Grade Assessment: II - A patient with  ?                          mild systemic disease. After reviewing the risks  ?                          and benefits, the patient was deemed in  ?                          satisfactory condition to undergo the procedure. ?                          After obtaining informed consent, the colonoscope  ?                          was passed under  direct vision. Throughout the  ?                          procedure, the patient's blood pressure, pulse, and  ?                          oxygen saturations were monitored continuously. The  ?                          Olympus CF-[id number] (#[id number]) Colonoscope was  ?                          introduced through the anus and advanced to the the  ?                          cecum, identified by appendiceal orifice and  ?                          ileocecal valve. The ileocecal valve, appendiceal  ?                          orifice, and rectum were photographed. The quality  ?                          of the bowel preparation was excellent. The  ?                          colonoscopy was performed without difficulty. The  ?                          patient tolerated the procedure well. The bowel  ?                          preparation used was SUPREP via split dose  ?                          instruction. ?Scope In: 2:00:57 PM ?Scope Out: 2:19:40 PM ?Scope Withdrawal Time: 0 hours 14 minutes 57 seconds  ?Total Procedure Duration: 0 hours 18 minutes  43 seconds  ?Findings:                 Four polyps were found in the descending colon,  ?                          transverse colon and cecum. The polyps were 3 to 8  ?                          mm in size. These polyps were removed with a cold  ?                          snare. Resection and retrieval were complete. ?                          A 1 mm polyp was found in the transverse colon. The  ?                          polyp was removed with a cold biopsy forceps.  ?                          Resection and retrieval were complete. ?                          Multiple small-mouthed diverticula were found in  ?                          the sigmoid colon. ?                          The exam was otherwise without abnormality on  ?                          direct and retroflexion views. ?Complications:            No immediate complications. Estimated blood loss:  ?                           None. ?Estimated Blood Loss:     Estimated blood loss: none. ?Impression:               - Four 3 to 8 mm polyps in the descending colon, in  ?                          the transverse colon and in the cecum, removed with  ?                          a cold snare. Resected and retrieved. ?                          - One 1 mm polyp in the transverse colon, removed  ?                          with a cold biopsy forceps. Resected and retrieved. ?                          -  Diverticulosis in the sigmoid colon. ?                          - The examination was otherwise normal on direct  ?                          and retroflexion views. ?Recommendation:           - Repeat colonoscopy in 5 years for surveillance. ?                          - Patient has a contact number available for  ?                          emergencies. The signs and symptoms of potential  ?                          delayed complications were discussed with the  ?                          patient. Return to normal activities tomorrow.  ?                          Written discharge instructions were provided to the  ?                          patient. ?                          - Resume previous diet. ?                          - Continue present medications. ?                          - Await pathology results. ?Docia Chuck. Henrene Pastor, MD ?03/05/2022 2:34:42 PM ?This report has been signed electronically. ?

## 2022-03-05 NOTE — Progress Notes (Signed)
Pt non-responsive, VVS, Report to RN  °

## 2022-03-05 NOTE — Progress Notes (Signed)
VS-CW  Pt's states no medical or surgical changes since previsit or office visit.  

## 2022-03-05 NOTE — Progress Notes (Signed)
Called to room to assist during endoscopic procedure.  Patient ID and intended procedure confirmed with present staff. Received instructions for my participation in the procedure from the performing physician.  

## 2022-03-09 ENCOUNTER — Telehealth: Payer: Self-pay | Admitting: *Deleted

## 2022-03-09 NOTE — Telephone Encounter (Signed)
?  Follow up Call- ? ? ?  03/05/2022  ?  1:23 PM  ?Call back number  ?Post procedure Call Back phone  # [phone]  ?Permission to leave phone message Yes  ?  ?No answer at 2nd attempt follow up phone call.  Left message on voicemail.   ?

## 2022-03-09 NOTE — Telephone Encounter (Signed)
First attempt for follow up phone call. No answer at number given.  Left message on voicemail.   ?

## 2022-03-10 ENCOUNTER — Encounter: Payer: Self-pay | Admitting: Internal Medicine

## 2022-03-11 ENCOUNTER — Encounter: Payer: Self-pay | Admitting: Acute Care

## 2022-03-11 ENCOUNTER — Ambulatory Visit (INDEPENDENT_AMBULATORY_CARE_PROVIDER_SITE_OTHER): Payer: Medicare HMO | Admitting: Acute Care

## 2022-03-11 DIAGNOSIS — F17210 Nicotine dependence, cigarettes, uncomplicated: Secondary | ICD-10-CM | POA: Diagnosis not present

## 2022-03-11 DIAGNOSIS — R69 Illness, unspecified: Secondary | ICD-10-CM | POA: Diagnosis not present

## 2022-03-11 NOTE — Progress Notes (Signed)
Virtual Visit via Telephone Note ? ?I connected with Patrick Cannon on 09/16/21 at  2:00 PM EST by telephone and verified that I am speaking with the correct person using two identifiers. ? ?Location: ?Patient: Home ?Provider: Working from home ?  ?I discussed the limitations, risks, security and privacy concerns of performing an evaluation and management service by telephone and the availability of in person appointments. I also discussed with the patient that there may be a patient responsible charge related to this service. The patient expressed understanding and agreed to proceed. ? ?Shared Decision Making Visit Lung Cancer Screening Program ?[phone]) ? ? ?Eligibility: ?Age 66 y.o. ?Pack Years Smoking History Calculation 26 ?(# packs/per year x # years smoked) ?Recent History of coughing up blood  no ?Unexplained weight loss? no ?( >Than 15 pounds within the last 6 months ) ?Prior History Lung / other cancer no ?(Diagnosis within the last 5 years already requiring surveillance chest CT Scans). ?Smoking Status Current Smoker ?Former Smokers: Years since quit: NA ? Quit Date: NA ? ?Visit Components: ?Discussion included one or more decision making aids. yes ?Discussion included risk/benefits of screening. yes ?Discussion included potential follow up diagnostic testing for abnormal scans. yes ?Discussion included meaning and risk of over diagnosis. yes ?Discussion included meaning and risk of False Positives. yes ?Discussion included meaning of total radiation exposure. yes ? ?Counseling Included: ?Importance of adherence to annual lung cancer LDCT screening. yes ?Impact of comorbidities on ability to participate in the program. yes ?Ability and willingness to under diagnostic treatment. yes ? ?Smoking Cessation Counseling: ?Current Smokers:  ?Discussed importance of smoking cessation. yes ?Information about tobacco cessation classes and interventions provided to patient. yes ?Patient provided with "ticket" for LDCT  Scan. yes ?Symptomatic Patient. yes ? Counseling(Intermediate counseling: > three minutes) [id number] ?Diagnosis Code: Tobacco Use Z72.0 ?Asymptomatic Patient no ? Counseling NA ?Former Smokers:  ?Discussed the importance of maintaining cigarette abstinence. yes ?Diagnosis Code: Personal History of Nicotine Dependence. C37.628 ?Information about tobacco cessation classes and interventions provided to patient. Yes ?Patient provided with "ticket" for LDCT Scan. yes ?Written Order for Lung Cancer Screening with LDCT placed in Epic. Yes ?(CT Chest Lung Cancer Screening Low Dose W/O CM) [id number] ?Z12.2-Screening of respiratory organs ?Z87.891-Personal history of nicotine dependence ? ? ?I spent 25 minutes of face to face time with him discussing the risks and benefits of lung cancer screening. We viewed a power point together that explained in detail the above noted topics. We took the time to pause the power point at intervals to allow for questions to be asked and answered to ensure understanding. We discussed that he had taken the single most powerful action possible to decrease his risk of developing lung cancer when he quit smoking. I counseled him to remain smoke free, and to contact me if he ever had the desire to smoke again so that I can provide resources and tools to help support the effort to remain smoke free. We discussed the time and location of the scan, and that either  Doroteo Glassman RN or I will call with the results within  24-48 hours of receiving them. He has my card and contact information in the event he needs to speak with me, in addition to a copy of the power point we reviewed as a resource. He verbalized understanding of all of the above and had no further questions upon leaving the office.  ? ? ? ?I explained to the patient that there has been a  high incidence of coronary artery disease noted on these exams. I explained that this is a non-gated exam therefore degree or severity cannot be determined.  This patient is not on statin therapy. I have asked the patient to follow-up with their PCP regarding any incidental finding of coronary artery disease and management with diet or medication as they feel is clinically indicated. The patient verbalized understanding of the above and had no further questions. ? ?  ?I spent 3 minutes counseling on smoking cessation and the health risks of continued tobacco abuse  ? ? ?Ronson Hagins D. Harris, NP-C ?[facility] ?Personal contact information can be found on Amion  ?03/11/2022, 11:11 AM ? ? ? ? ? ? ? ? ? ?

## 2022-03-11 NOTE — Patient Instructions (Signed)
Thank you for participating in the [facility]. It was our pleasure to meet you today. We will call you with the results of your scan within the next few days. Your scan will be assigned a Lung RADS category score by the physicians reading the scans.  This Lung RADS score determines follow up scanning.  See below for description of categories, and follow up screening recommendations. We will be in touch to schedule your follow up screening annually or based on recommendations of our providers. We will fax a copy of your scan results to your Primary Care Physician, or the physician who referred you to the program, to ensure they have the results. Please call the office if you have any questions or concerns regarding your scanning experience or results.  Our office number is [phone]. Please speak with Denise Phelps, RN. , or  Denise Buckner RN, They are  our Lung Cancer Screening RN.'s If They are unavailable when you call, Please leave a message on the voice mail. We will return your call at our earliest convenience.This voice mail is monitored several times a day.  Remember, if your scan is normal, we will scan you annually as long as you continue to meet the criteria for the program. (Age 55-77, Current smoker or smoker who has quit within the last 15 years). If you are a smoker, remember, quitting is the single most powerful action that you can take to decrease your risk of lung cancer and other pulmonary, breathing related problems. We know quitting is hard, and we are here to help.  Please let us know if there is anything we can do to help you meet your goal of quitting. If you are a former smoker, congratulations. We are proud of you! Remain smoke free! Remember you can refer friends or family members through the number above.  We will screen them to make sure they meet criteria for the program. Thank you for helping us take better care of you by  participating in Lung Screening.  You can receive free nicotine replacement therapy ( patches, gum or mints) by calling 1-800-QUIT NOW. Please call so we can get you on the path to becoming  a non-smoker. I know it is hard, but you can do this!  Lung RADS Categories:  Lung RADS 1: no nodules or definitely non-concerning nodules.  Recommendation is for a repeat annual scan in 12 months.  Lung RADS 2:  nodules that are non-concerning in appearance and behavior with a very low likelihood of becoming an active cancer. Recommendation is for a repeat annual scan in 12 months.  Lung RADS 3: nodules that are probably non-concerning , includes nodules with a low likelihood of becoming an active cancer.  Recommendation is for a 6-month repeat screening scan. Often noted after an upper respiratory illness. We will be in touch to make sure you have no questions, and to schedule your 6-month scan.  Lung RADS 4 A: nodules with concerning findings, recommendation is most often for a follow up scan in 3 months or additional testing based on our provider's assessment of the scan. We will be in touch to make sure you have no questions and to schedule the recommended 3 month follow up scan.  Lung RADS 4 B:  indicates findings that are concerning. We will be in touch with you to schedule additional diagnostic testing based on our provider's  assessment of the scan.  Other options for assistance in smoking cessation (   As covered by your insurance benefits)  Hypnosis for smoking cessation  [employer] [phone]  Acupuncture for smoking cessation  [facility] [phone]   

## 2022-03-13 ENCOUNTER — Ambulatory Visit
Admission: RE | Admit: 2022-03-13 | Discharge: 2022-03-13 | Disposition: A | Discharge status: Home / Self Care | Payer: Medicare HMO | Source: Ambulatory Visit | Attending: Acute Care | Admitting: Acute Care

## 2022-03-13 DIAGNOSIS — Z87891 Personal history of nicotine dependence: Secondary | ICD-10-CM

## 2022-03-13 DIAGNOSIS — Z122 Encounter for screening for malignant neoplasm of respiratory organs: Secondary | ICD-10-CM

## 2022-03-13 DIAGNOSIS — R911 Solitary pulmonary nodule: Secondary | ICD-10-CM | POA: Diagnosis not present

## 2022-03-13 DIAGNOSIS — F17210 Nicotine dependence, cigarettes, uncomplicated: Secondary | ICD-10-CM

## 2022-03-13 DIAGNOSIS — M5134 Other intervertebral disc degeneration, thoracic region: Secondary | ICD-10-CM | POA: Diagnosis not present

## 2022-03-13 DIAGNOSIS — I2510 Atherosclerotic heart disease of native coronary artery without angina pectoris: Secondary | ICD-10-CM | POA: Diagnosis not present

## 2022-03-13 DIAGNOSIS — R69 Illness, unspecified: Secondary | ICD-10-CM | POA: Diagnosis not present

## 2022-03-18 ENCOUNTER — Other Ambulatory Visit: Payer: Self-pay | Admitting: Acute Care

## 2022-03-18 DIAGNOSIS — Z122 Encounter for screening for malignant neoplasm of respiratory organs: Secondary | ICD-10-CM

## 2022-03-18 DIAGNOSIS — Z87891 Personal history of nicotine dependence: Secondary | ICD-10-CM

## 2022-03-18 DIAGNOSIS — F17210 Nicotine dependence, cigarettes, uncomplicated: Secondary | ICD-10-CM

## 2022-06-02 NOTE — Progress Notes (Unsigned)
ACUTE VISIT No chief complaint on file.  HPI: Patrick Cannon is a 66 y.o. male, who is here today complaining of *** HPI  Review of Systems Rest see pertinent positives and negatives per HPI.  Current Outpatient Medications on File Prior to Visit  Medication Sig Dispense Refill  . colchicine 0.6 MG tablet Take 1 tablet (0.6 mg total) by mouth daily as needed. 30 tablet 2  . Multiple Vitamin (MULTIVITAMIN) tablet Take 1 tablet by mouth daily.    . sildenafil (REVATIO) 20 MG tablet TAKE 2 TABLETS BY MOUTH DAILY AS NEEDED 60 tablet 1  . triamcinolone cream (KENALOG) 0.1 % Apply 1 application topically 2 (two) times daily as needed. Left forearm. (Patient not taking: Reported on 02/04/2022) 30 g 1   No current facility-administered medications on file prior to visit.     Past Medical History:  Diagnosis Date  . Arthritis   . Hyperlipidemia    no meds  . Hypertension    no meds   Allergies  Allergen Reactions  . Pravastatin Other (See Comments)    Joint pain    Social History   Socioeconomic History  . Marital status: Divorced    Spouse name: Not on file  . Number of children: Not on file  . Years of education: Not on file  . Highest education level: Bachelor's degree (e.g., BA, AB, BS)  Occupational History  . Not on file  Tobacco Use  . Smoking status: Every Day    Packs/day: 0.50    Years: 52.00    Total pack years: 26.00    Types: Cigarettes    Start date: 04/27/1970  . Smokeless tobacco: Never  Vaping Use  . Vaping Use: Never used  Substance and Sexual Activity  . Alcohol use: Yes    Comment: almost daily  . Drug use: No  . Sexual activity: Not on file  Other Topics Concern  . Not on file  Social History Narrative  . Not on file   Social Determinants of Health   Financial Resource Strain: Low Risk  (05/31/2022)   Overall Financial Resource Strain (CARDIA)   . Difficulty of Paying Living Expenses: Not hard at all  Food Insecurity: No Food  Insecurity (05/31/2022)   Hunger Vital Sign   . Worried About Charity fundraiser in the Last Year: Never true   . Ran Out of Food in the Last Year: Never true  Transportation Needs: No Transportation Needs (05/31/2022)   PRAPARE - Transportation   . Lack of Transportation (Medical): No   . Lack of Transportation (Non-Medical): No  Physical Activity: Unknown (05/31/2022)   Exercise Vital Sign   . Days of Exercise per Week: 3 days   . Minutes of Exercise per Session: Not on file  Stress: No Stress Concern Present (05/31/2022)   [employer]   . Feeling of Stress : Not at all  Social Connections: Socially Isolated (05/31/2022)   Social Connection and Isolation Panel [NHANES]   . Frequency of Communication with Friends and Family: Three times a week   . Frequency of Social Gatherings with Friends and Family: Once a week   . Attends Religious Services: Never   . Active Member of Clubs or Organizations: No   . Attends Archivist Meetings: Not on file   . Marital Status: Divorced    There were no vitals filed for this visit. There is no height or weight  on file to calculate BMI.  Physical Exam  ASSESSMENT AND PLAN:  There are no diagnoses linked to this encounter.   No follow-ups on file.   Wynette Jersey G. Martinique, MD  [facility]. [place] office.  Discharge Instructions   None

## 2022-06-03 ENCOUNTER — Ambulatory Visit (INDEPENDENT_AMBULATORY_CARE_PROVIDER_SITE_OTHER): Payer: Medicare HMO | Admitting: Family Medicine

## 2022-06-03 ENCOUNTER — Encounter: Payer: Self-pay | Admitting: Family Medicine

## 2022-06-03 VITALS — BP 128/80 | HR 77 | Resp 16 | Ht 69.0 in | Wt 226.1 lb

## 2022-06-03 DIAGNOSIS — M7701 Medial epicondylitis, right elbow: Secondary | ICD-10-CM

## 2022-06-03 DIAGNOSIS — M7541 Impingement syndrome of right shoulder: Secondary | ICD-10-CM | POA: Diagnosis not present

## 2022-06-03 MED ORDER — CELECOXIB 100 MG PO CAPS: 100.0000 mg | ORAL_CAPSULE | Freq: Two times a day (BID) | ORAL | 0 refills | Status: AC | PRN

## 2022-06-03 NOTE — Patient Instructions (Signed)
A few things to remember from today's visit:  Shoulder impingement syndrome, right - Plan: Ambulatory referral to Sports Medicine, celecoxib (CELEBREX) 100 MG capsule  Medial epicondylitis of right elbow - Plan: Ambulatory referral to Sports Medicine, celecoxib (CELEBREX) 100 MG capsule  If you need refills please call your pharmacy. Do not use My Chart to request refills or for acute issues that need immediate attention.   Golfer's Elbow  Golfer's elbow (medial epicondylitis) is a condition that results from inflammation of the strong bands of tissue (tendons) that attach your forearm muscles to the inside of your bone at the elbow. These tendons affect the muscles that bend the palm toward the wrist (flexion). The tendons become less flexible with age. This condition is called golfer's elbow because it is more common among people who constantly bend and twist their wrists, such as golfers. This injury is usually caused by repeated use of the same muscles. What are the causes? This condition is caused by: Repeatedly flexing, turning, or twisting your wrist. Frequently gripping objects with your hands. Sudden injury. What increases the risk? This condition is more likely to develop in people who play golf, baseball, or tennis. This injury is more common among people who have jobs that require the constant use of their hands, such as: People who use computers. Carpenters. Butchers. Musicians. What are the signs or symptoms? This condition causes elbow pain that may spread to your forearm and upper arm. Symptoms of this condition include: Pain at the inner elbow, forearm, or wrist. A weak grip in the hand. The pain may get worse when you bend your wrist downward. How is this diagnosed? This condition is diagnosed based on your symptoms, your medical history, and a physical exam. During the exam, your health care provider may: Test your grip strength. Move your wrist to check for  pain. You may also have an MRI to: Confirm the diagnosis. Look for other issues. Check for tears in the ligaments, muscles, or tendons. How is this treated? Treatment for this condition includes: Stopping all activities that make you bend or twist your elbow or wrist and waiting until your pain and other symptoms go away before resuming those activities. Wearing an elbow brace or wrist splint to restrict the movements that cause symptoms. Icing your inner elbow, forearm, or wrist to relieve pain. Taking NSAIDs, such as ibuprofen, or getting corticosteroid injections to reduce pain and swelling. Doing stretching, range-of-motion, and strengthening exercises (physical therapy) as told by your health care provider. In rare cases, surgery may be needed if your condition does not improve. Follow these instructions at home: If you have a brace or splint: Wear the brace or splint as told by your health care provider. Remove it only as told by your health care provider. Check the skin around the brace or splint every day. Tell your health care provider about any concerns. Loosen the brace or splint if your fingers tingle, become numb, or turn cold and blue. Keep it clean. If the brace or splint is not waterproof: Do not let it get wet. Cover it with a watertight covering when you take a bath or shower. Managing pain, stiffness, and swelling  If directed, put ice on the injured area. To do this: If you have a removable brace or splint, remove it as told by your health care provider. Put ice in a plastic bag. Place a towel between your skin and the bag. Leave the ice on for 20 minutes, 2-3 times  a day. Remove the ice if your skin turns bright red. This is very important. If you cannot feel pain, heat, or cold, you have a greater risk of damage to the area. Move your fingers often to avoid stiffness and swelling. Activity Rest your injured area as told by your health care provider. Return to  your normal activities as told by your health care provider. Ask your health care provider what activities are safe for you. Do exercises as told by your health care provider. Lifestyle If your condition is caused by sports, work with a trainer to make sure that you: Use the correct technique. Use the proper equipment. If your condition is work related, talk with your employer about ways to manage your condition at work. General instructions Take over-the-counter and prescription medicines only as told by your health care provider. Do not use any products that contain nicotine or tobacco. These products include cigarettes, chewing tobacco, and vaping devices, such as e-cigarettes. If you need help quitting, ask your health care provider. Keep all follow-up visits. This is important. How is this prevented? Before and after activity: Warm up and stretch before being active. Cool down and stretch after being active. Give your body time to rest between periods of activity. During activity: Make sure to use equipment that fits you. If you play golf, slow your golf swing to reduce shock in the arm when making contact with the ball. Maintain physical fitness, including: Strength. Flexibility. Endurance. Do exercises to strengthen the forearm muscles. Contact a health care provider if: Your pain does not improve or it gets worse. You notice numbness in your hand. Get help right away if: Your pain is severe. You cannot move your wrist. Summary Golfer's elbow, also called medial epicondylitis, is a condition that results from inflammation of the strong bands of tissue (tendons) that attach your forearm muscles to the inside of your bone at the elbow. This injury usually results from overuse. Symptoms of this condition include decreased grip strength and pain at the inner elbow, forearm, or wrist. This injury is treated with rest, a brace or splint, ice, medicines, physical therapy, and  surgery as needed. This information is not intended to replace advice given to you by your health care provider. Make sure you discuss any questions you have with your health care provider. Document Revised: 04/30/2020 Document Reviewed: 04/30/2020 Elsevier Patient Education  [place].  Please be sure medication list is accurate. If a new problem present, please set up appointment sooner than planned today.

## 2022-06-11 ENCOUNTER — Ambulatory Visit
Admission: RE | Admit: 2022-06-11 | Discharge: 2022-06-11 | Disposition: A | Discharge status: Home / Self Care | Payer: Medicare HMO | Source: Ambulatory Visit | Attending: Sports Medicine | Admitting: Sports Medicine

## 2022-06-11 ENCOUNTER — Ambulatory Visit: Payer: Medicare HMO | Admitting: Sports Medicine

## 2022-06-11 VITALS — BP 158/87 | Ht 70.0 in | Wt 220.0 lb

## 2022-06-11 DIAGNOSIS — M7701 Medial epicondylitis, right elbow: Secondary | ICD-10-CM

## 2022-06-11 DIAGNOSIS — M25511 Pain in right shoulder: Secondary | ICD-10-CM

## 2022-06-11 NOTE — Progress Notes (Signed)
   Subjective:    Patient ID: Patrick Cannon, male    DOB: 1956/04/15, 66 y.o.   MRN: [id number]  HPI chief complaint: Right shoulder and right elbow pain  Patient is a very pleasant 66 year old male that presents today with a couple of different complaints.  Main complaint is right shoulder pain that began about a year ago while working out.  He was doing 40 pound dumbbell presses and as he rotated his right arm to sit down the dumbbell he felt a crunch in his right shoulder.  Since then he has had persistent anterior lateral pain when working out.  Also pain with certain motions throughout the day.  He has not had any specific treatment or workup for this thus far.  He has modified his lifting which is why he thinks he may have medial right elbow pain.  That pain has been present for about 2 months.  No known trauma here.  He denies surgery to either the shoulder or elbow in the past.  No significant injury to the elbow in the past.  He was recently prescribed some Celebrex by his PCP but has yet to start taking that.  Past medical history reviewed Medications reviewed Allergies reviewed    Review of Systems As above    Objective:   Physical Exam  Well-developed, well-nourished.  No acute distress  Right shoulder: Nearly full range of motion.  He does lack about 10 to 15 degrees of active internal rotation on the right compared to the left.  No tenderness to palpation.  Positive empty can, positive Hawkins.  Rotator cuff strength is 4+/5 with resisted supraspinatus and 4/5 with resisted external rotation.  He is neurovascularly intact distally.  Right elbow: Full range of motion.  No effusion.  He is tender to palpation directly over the medial epicondyle.  Good pulses distally      Assessment & Plan:   Chronic right shoulder pain secondary to rotator cuff tear Medial right elbow pain secondary to medial epicondylitis  We are going to get x-rays of the right shoulder and he will  return to the office in a week or 2 for complete right shoulder ultrasound.  Clinical exam suggest a full-thickness rotator cuff tear so we will see if we can correlate that with ultrasound at follow-up.  He may try his Celebrex but have also recommended trying some Voltaren gel for his medial epicondylitis.  Also for his medial epicondylitis, he can try counterforce brace when lifting and we will give him a home exercise program.  This note was dictated using Dragon naturally speaking software and may contain errors in syntax, spelling, or content which have not been identified prior to signing this note.

## 2022-06-15 ENCOUNTER — Other Ambulatory Visit: Payer: Self-pay | Admitting: Family Medicine

## 2022-06-15 DIAGNOSIS — N529 Male erectile dysfunction, unspecified: Secondary | ICD-10-CM

## 2022-06-16 ENCOUNTER — Ambulatory Visit (INDEPENDENT_AMBULATORY_CARE_PROVIDER_SITE_OTHER): Payer: Medicare HMO | Admitting: Sports Medicine

## 2022-06-16 ENCOUNTER — Ambulatory Visit: Payer: Self-pay

## 2022-06-16 VITALS — BP 162/99 | Ht 70.0 in | Wt 220.0 lb

## 2022-06-16 DIAGNOSIS — M25511 Pain in right shoulder: Secondary | ICD-10-CM

## 2022-06-16 NOTE — Progress Notes (Addendum)
Patient ID: Patrick Cannon, male   DOB: 1955/12/29, 66 y.o.   MRN: [id number]  Patrick Cannon presents today for an ultrasound of his right shoulder.  Please see the office notes from August 10 for details regarding history and physical exam findings.  X-rays of his shoulder show only minimal degenerative changes at the [name] joint and glenohumeral joint.  Ultrasound findings as below.  Right shoulder ultrasound:  Biceps tendon was well visualized in both long and short axis.  Long axis view showed some hypoechoic changes in the proximal tendon which may represent partial tearing here.  Subscapularis was also visualized.  Although it appears to be intact, there is significant atrophy and edema in this area.  Anterior portion of the supraspinatus appears to be torn and retracted.  There is also cortical irregularity of the humeral head.  Infraspinatus also appears to be torn from the humerus.  AC joint shows mild degenerative changes.  These findings are consistent with a probably chronically torn rotator cuff.  I had a long discussion with [name] regarding these findings.  His injury was a year ago.  He really does not bother that much in his day-to-day life.  He only notices that there are certain lifts that he cannot do in the gym.  I discussed referral to orthopedics to discuss possible surgical options but his symptoms are not currently severe enough to warrant that.  Given the retraction seen on his ultrasound I think this is a chronically torn cuff which is probably not amenable to repair.  However, even understands that he could consider debridement for pain control down the road if his symptoms worsen.  We also discussed a trial of nitroglycerin patches.  At this point in time, Nature prefers to simply live with this and see how things go.  Follow-up as needed.  This note was dictated using Dragon naturally speaking software and may contain errors in syntax, spelling, or content which have not been identified  prior to signing this note.

## 2022-08-12 NOTE — Progress Notes (Unsigned)
ACUTE VISIT Chief Complaint  Patient presents with   Shoulder Pain    Left side, started last weekend.   HPI: Mr.Patrick Cannon is a 66 y.o. male, who is here today complaining of left shoulder pain, which began last weekend after performing yard work and cleaning up branches. The pain is described as more of an ache than pain, with limitation of movement. He rates the pain as a 1 or 2 on a scale of 1 to 10. He is currently taking ibuprofen for pain, mainly at night. Pain exacerbated by movement and when lying on left side. No erythema or edema.  He has seen Dr Micheline Chapman for right shoulder pain, chronically torn rotator cuff , which is now asymptomatic. He saw a sports medicine last on August 10th/2023. Negative for neck pain, numbness, or tingling on the left UE.  He continues to smoke and undergoes annual lung cancer screenings.  Negative for cough,wheezing,or SOB.  Review of Systems  Constitutional:  Negative for chills and fever.  HENT:  Negative for sore throat and trouble swallowing.   Cardiovascular:  Negative for chest pain and palpitations.  Gastrointestinal:  Negative for abdominal pain and nausea.  Rest see pertinent positives and negatives per HPI.  Current Outpatient Medications on File Prior to Visit  Medication Sig Dispense Refill   colchicine 0.6 MG tablet Take 1 tablet (0.6 mg total) by mouth daily as needed. 30 tablet 2   Multiple Vitamin (MULTIVITAMIN) tablet Take 1 tablet by mouth daily.     sildenafil (REVATIO) 20 MG tablet TAKE 2 TABLETS BY MOUTH DAILY AS NEEDED **CHEAPEST AT [facility] ON [facility]** 60 tablet 1   triamcinolone cream (KENALOG) 0.1 % Apply 1 application topically 2 (two) times daily as needed. Left forearm. 30 g 1   No current facility-administered medications on file prior to visit.   Past Medical History:  Diagnosis Date   Arthritis    Hyperlipidemia    no meds   Hypertension    no meds   Allergies  Allergen Reactions   Pravastatin Other  (See Comments)    Joint pain   Social History   Socioeconomic History   Marital status: Divorced    Spouse name: Not on file   Number of children: Not on file   Years of education: Not on file   Highest education level: Bachelor's degree (e.g., BA, AB, BS)  Occupational History   Not on file  Tobacco Use   Smoking status: Every Day    Packs/day: 0.50    Years: 52.00    Total pack years: 26.00    Types: Cigarettes    Start date: 04/27/1970   Smokeless tobacco: Never  Vaping Use   Vaping Use: Never used  Substance and Sexual Activity   Alcohol use: Yes    Comment: almost daily   Drug use: No   Sexual activity: Not on file  Other Topics Concern   Not on file  Social History Narrative   Not on file   Social Determinants of Health   Financial Resource Strain: Low Risk  (05/31/2022)   Overall Financial Resource Strain (CARDIA)    Difficulty of Paying Living Expenses: Not hard at all  Food Insecurity: No Food Insecurity (05/31/2022)   Hunger Vital Sign    Worried About Running Out of Food in the Last Year: Never true    Ran Out of Food in the Last Year: Never true  Transportation Needs: No Transportation Needs (05/31/2022)  PRAPARE - Hydrologist (Medical): No    Lack of Transportation (Non-Medical): No  Physical Activity: Unknown (05/31/2022)   Exercise Vital Sign    Days of Exercise per Week: 3 days    Minutes of Exercise per Session: Not on file  Stress: No Stress Concern Present (05/31/2022)   Liberal    Feeling of Stress : Not at all  Social Connections: Socially Isolated (05/31/2022)   Social Connection and Isolation Panel [NHANES]    Frequency of Communication with Friends and Family: Three times a week    Frequency of Social Gatherings with Friends and Family: Once a week    Attends Religious Services: Never    Marine scientist or Organizations: No    Attends  Music therapist: Not on file    Marital Status: Divorced   Vitals:   08/14/22 1031  BP: 128/80  Pulse: 82  Resp: 12  Temp: 98.6 F (37 C)  SpO2: 97%   Body mass index is 32.46 kg/m.  Physical Exam Vitals and nursing note reviewed.  Constitutional:      General: He is not in acute distress.    Appearance: He is well-developed.  HENT:     Head: Normocephalic and atraumatic.  Eyes:     Conjunctiva/sclera: Conjunctivae normal.  Cardiovascular:     Rate and Rhythm: Normal rate and regular rhythm.  Pulmonary:     Effort: Pulmonary effort is normal. No respiratory distress.     Breath sounds: Normal breath sounds.  Musculoskeletal:     Left shoulder: Tenderness (anterior aspect) and crepitus present. No effusion or bony tenderness. Decreased range of motion. Normal pulse.     Comments: Left shoulder: No significant deformity, edema, or erythema appreciated. + Muscle atrophy,moderate and marked limitation of ROM. Rotator cuff maneuvers do not elicit significant pain.  Lymphadenopathy:     Cervical: No cervical adenopathy.  Skin:    Findings: No erythema or rash.  Neurological:     General: No focal deficit present.     Mental Status: He is alert and oriented to person, place, and time.   ASSESSMENT AND PLAN:  Mr.Patrick Cannon was seen today for shoulder pain.  Diagnoses and all orders for this visit: Orders Placed This Encounter  Procedures   Flu Vaccine QUAD High Dose(Fluad)   Ambulatory referral to Sports Medicine   Acute pain of left shoulder Possible causes and treatment options discussed.      Referral to sports medicine placed as requested for further evaluation and management X-ray of the left shoulder deferred to Dr Alinda Money office Prescribe Celebrex 100 mg bid x 7-10 days then as needed. Begin home exercises for the left shoulder as previously instructed for the right shoulder     -     celecoxib (CELEBREX) 100 MG capsule; Take 1 capsule (100 mg  total) by mouth 2 (two) times daily for 15 days.  Adhesive capsulitis of left shoulder -     Ambulatory referral to Sports Medicine  Need for influenza vaccination -     Flu Vaccine QUAD High Dose(Fluad)    Return if symptoms worsen or fail to improve.  Patrick Diehl G. [place], MD  [facility]. [place] office.

## 2022-08-14 ENCOUNTER — Ambulatory Visit (INDEPENDENT_AMBULATORY_CARE_PROVIDER_SITE_OTHER): Payer: Medicare HMO | Admitting: Family Medicine

## 2022-08-14 ENCOUNTER — Encounter: Payer: Self-pay | Admitting: Family Medicine

## 2022-08-14 VITALS — BP 128/80 | HR 82 | Temp 98.6°F | Resp 12 | Ht 70.0 in | Wt 226.2 lb

## 2022-08-14 DIAGNOSIS — Z23 Encounter for immunization: Secondary | ICD-10-CM | POA: Diagnosis not present

## 2022-08-14 DIAGNOSIS — M7502 Adhesive capsulitis of left shoulder: Secondary | ICD-10-CM | POA: Diagnosis not present

## 2022-08-14 DIAGNOSIS — M25512 Pain in left shoulder: Secondary | ICD-10-CM | POA: Diagnosis not present

## 2022-08-14 MED ORDER — CELECOXIB 100 MG PO CAPS: 100.0000 mg | ORAL_CAPSULE | Freq: Two times a day (BID) | ORAL | 0 refills | Status: AC

## 2022-08-14 NOTE — Patient Instructions (Addendum)
A few things to remember from today's visit:  Acute pain of left shoulder - Plan: Ambulatory referral to Sports Medicine, celecoxib (CELEBREX) 100 MG capsule  Adhesive capsulitis of left shoulder - Plan: Ambulatory referral to Sports Medicine  Celebrex 100 mg 2 times daily for 10 days then daily as needed.No Ibuprofen. Appt with sport medicine will be arranged. Start shoulder PT as you did for left shoulder.  If you need refills for medications you take chronically, please call your pharmacy. Do not use My Chart to request refills or for acute issues that need immediate attention. If you send a my chart message, it may take a few days to be addressed, specially if I am not in the office.  Please be sure medication list is accurate. If a new problem present, please set up appointment sooner than planned today.

## 2022-08-20 ENCOUNTER — Ambulatory Visit: Payer: Medicare HMO | Admitting: Sports Medicine

## 2022-08-20 VITALS — BP 133/88 | Ht 70.0 in | Wt 220.0 lb

## 2022-08-20 DIAGNOSIS — M25512 Pain in left shoulder: Secondary | ICD-10-CM | POA: Diagnosis not present

## 2022-08-20 NOTE — Progress Notes (Signed)
    SUBJECTIVE:   CHIEF COMPLAINT / HPI:   Patient is a 66 year old male presenting with new left shoulder pain that started 2 weeks ago after yard work in which he was cutting tree branches.  Describes pain as aching pain localized at the top of his shoulder and triceps.  Reports difficulty with actively raising his left hand past 80 degrees.  Notes some crunching sounds with active elevation of his arm.  He is able to passively raise above head.  Tried ibuprofen with no significant improvement.  He reports intermittent numbness in his left hand.  PERTINENT  PMH / PSH: Reviewed  OBJECTIVE:   BP 133/88   Ht '5\' 10"'$  (1.778 m)   Wt 220 lb (99.8 kg)   BMI 31.57 kg/m    Physical Exam  General:  Alert, well appearing, NAD  Left Shoulder Noted supraspinatus atrophy.  TTP over the trapezius muscle. Limited range of motion with weakness of the left shoulder.  4/5 strength with resisted supraspinatus. Active extension of the fore arm to about 80 degrees Normal abduction and external rotation.  limited internal rotation   ASSESSMENT/PLAN:   Patient's left shoulder pain is likely due to rotator cuff injury.  Provided patient with shoulder strengthening exercises. Encouraged relative rest and avoid overhead lifting.  Follow-up in 3 weeks for reevaluation.    Alen Bleacher, MD [place]   Patient seen and evaluated with the resident.  I agree with the above plan of care.  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  Physical exam shows atrophy in the supraspinatus fossa as well as weakness with resisted supraspinatus.  He has a documented history of similar findings in the right shoulder which improved with conservative treatment.  We will attempt conservative treatment in the left shoulder as well.  His pain is tolerable so we will not perform a subacromial cortisone injection today.  He will start range of motion and  light strengthening exercises and will follow-up with me again in 3 weeks.  If he is not making progress then we will ultrasound his left shoulder at that visit.  This note was dictated using Dragon naturally speaking software and may contain errors in syntax, spelling, or content which have not been identified prior to signing this note.

## 2022-09-10 ENCOUNTER — Ambulatory Visit: Payer: Medicare HMO | Admitting: Sports Medicine

## 2022-09-17 ENCOUNTER — Ambulatory Visit: Payer: Medicare HMO | Admitting: Sports Medicine

## 2022-09-17 VITALS — BP 150/98 | Ht 70.0 in | Wt 220.0 lb

## 2022-09-17 DIAGNOSIS — M75102 Unspecified rotator cuff tear or rupture of left shoulder, not specified as traumatic: Secondary | ICD-10-CM | POA: Diagnosis not present

## 2022-09-17 NOTE — Progress Notes (Signed)
   Subjective:    Patient ID: Patrick Cannon, male    DOB: [birth year], 66 y.o.   MRN: [id number]  HPI  Patrick Cannon presents today for follow-up of suspected attritional left rotator cuff tear.  He is improving.  He states he is about 60% improved.  He has regained some range of motion and some strength but still struggles with certain motions.  Pain is tolerable.  Mild ache at night.  He continues to lift weights but is avoiding overhead lifting in the gym.    Review of Systems As above    Objective:   Physical Exam  Well-developed, well-nourished.  No acute distress  Left shoulder: Active forward flexion is to 160 degrees.  Active abduction is to 90 degrees.  He has good internal and external rotation.  Supraspinatus strength is 4/5 on the left compared to 5/5 on the right.  Good strength with resisted supraspinatus and subscapularis.  Neurovascularly intact distally.      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  He is doing his own home rehabilitation and will continue with this for another month or so.  He had a similar issue with his right shoulder in the past which responded well to conservative treatment.  We will set up a tentative follow-up appointment in 4 weeks.  If he does not continue to improve and he will keep that appointment at which point we could consider imaging either in the form of ultrasound or MRI.  This note was dictated using Dragon naturally speaking software and may contain errors in syntax, spelling, or content which have not been identified prior to signing this note.

## 2022-10-07 ENCOUNTER — Other Ambulatory Visit: Payer: Self-pay | Admitting: Family Medicine

## 2022-10-07 DIAGNOSIS — M25512 Pain in left shoulder: Secondary | ICD-10-CM

## 2022-10-15 ENCOUNTER — Ambulatory Visit: Payer: Medicare HMO | Admitting: Sports Medicine

## 2022-11-06 ENCOUNTER — Other Ambulatory Visit: Payer: Self-pay | Admitting: Family Medicine

## 2022-11-06 DIAGNOSIS — M25512 Pain in left shoulder: Secondary | ICD-10-CM

## 2022-11-18 ENCOUNTER — Ambulatory Visit (INDEPENDENT_AMBULATORY_CARE_PROVIDER_SITE_OTHER): Payer: Medicare HMO

## 2022-11-18 VITALS — BP 120/60 | HR 72 | Temp 98.1°F | Ht 70.0 in | Wt 231.2 lb

## 2022-11-18 DIAGNOSIS — Z0000 Encounter for general adult medical examination without abnormal findings: Secondary | ICD-10-CM | POA: Diagnosis not present

## 2022-11-18 NOTE — Patient Instructions (Addendum)
[name] , Thank you for taking time to come for your Medicare Wellness Visit. I appreciate your ongoing commitment to your health goals. Please review the following plan we discussed and let me know if I can assist you in the future.   These are the goals we discussed:  Goals       Lose Weight (pt-stated)      I want to lose about 30lbs.        This is a list of the screening recommended for you and due dates:  Health Maintenance  Topic Date Due   DTaP/Tdap/Td vaccine (1 - Tdap) Never done   COVID-19 Vaccine (3 - 2023-24 season) 12/04/2022*   Zoster (Shingles) Vaccine (1 of 2) 02/17/2023*   Screening for Lung Cancer  03/14/2023   Medicare Annual Wellness Visit  11/19/2023   Colon Cancer Screening  03/06/2027   Pneumonia Vaccine  Completed   Flu Shot  Completed   Hepatitis C Screening: USPSTF Recommendation to screen - Ages 18-79 yo.  Completed   HPV Vaccine  Aged Out  *Topic was postponed. The date shown is not the original due date.    Advanced directives: Advance directive discussed with you today. Even though you declined this today, please call our office should you change your mind, and we can give you the proper paperwork for you to fill out.   Conditions/risks identified: None  Next appointment: Follow up in one year for your annual wellness visit.    Preventive Care 64 Years and Older, Male  Preventive care refers to lifestyle choices and visits with your health care provider that can promote health and wellness. What does preventive care include? A yearly physical exam. This is also called an annual well check. Dental exams once or twice a year. Routine eye exams. Ask your health care provider how often you should have your eyes checked. Personal lifestyle choices, including: Daily care of your teeth and gums. Regular physical activity. Eating a healthy diet. Avoiding tobacco and drug use. Limiting alcohol use. Practicing safe sex. Taking low doses of  aspirin every day. Taking vitamin and mineral supplements as recommended by your health care provider. What happens during an annual well check? The services and screenings done by your health care provider during your annual well check will depend on your age, overall health, lifestyle risk factors, and family history of disease. Counseling  Your health care provider may ask you questions about your: Alcohol use. Tobacco use. Drug use. Emotional well-being. Home and relationship well-being. Sexual activity. Eating habits. History of falls. Memory and ability to understand (cognition). Work and work Statistician. Screening  You may have the following tests or measurements: Height, weight, and BMI. Blood pressure. Lipid and cholesterol levels. These may be checked every 5 years, or more frequently if you are over 54 years old. Skin check. Lung cancer screening. You may have this screening every year starting at age 15 if you have a 30-pack-year history of smoking and currently smoke or have quit within the past 15 years. Fecal occult blood test (FOBT) of the stool. You may have this test every year starting at age 72. Flexible sigmoidoscopy or colonoscopy. You may have a sigmoidoscopy every 5 years or a colonoscopy every 10 years starting at age 43. Prostate cancer screening. Recommendations will vary depending on your family history and other risks. Hepatitis C blood test. Hepatitis B blood test. Sexually transmitted disease (STD) testing. Diabetes screening. This is done by checking your blood  sugar (glucose) after you have not eaten for a while (fasting). You may have this done every 1-3 years. Abdominal aortic aneurysm (AAA) screening. You may need this if you are a current or former smoker. Osteoporosis. You may be screened starting at age 5 if you are at high risk. Talk with your health care provider about your test results, treatment options, and if necessary, the need for more  tests. Vaccines  Your health care provider may recommend certain vaccines, such as: Influenza vaccine. This is recommended every year. Tetanus, diphtheria, and acellular pertussis (Tdap, Td) vaccine. You may need a Td booster every 10 years. Zoster vaccine. You may need this after age 42. Pneumococcal 13-valent conjugate (PCV13) vaccine. One dose is recommended after age 43. Pneumococcal polysaccharide (PPSV23) vaccine. One dose is recommended after age 26. Talk to your health care provider about which screenings and vaccines you need and how often you need them. This information is not intended to replace advice given to you by your health care provider. Make sure you discuss any questions you have with your health care provider. Document Released: 11/15/2015 Document Revised: 07/08/2016 Document Reviewed: 08/20/2015 Elsevier Interactive Patient Education  2017 [place] Prevention in the Home Falls can cause injuries. They can happen to people of all ages. There are many things you can do to make your home safe and to help prevent falls. What can I do on the outside of my home? Regularly fix the edges of walkways and driveways and fix any cracks. Remove anything that might make you trip as you walk through a door, such as a raised step or threshold. Trim any bushes or trees on the path to your home. Use bright outdoor lighting. Clear any walking paths of anything that might make someone trip, such as rocks or tools. Regularly check to see if handrails are loose or broken. Make sure that both sides of any steps have handrails. Any raised decks and porches should have guardrails on the edges. Have any leaves, snow, or ice cleared regularly. Use sand or salt on walking paths during winter. Clean up any spills in your garage right away. This includes oil or grease spills. What can I do in the bathroom? Use night lights. Install grab bars by the toilet and in the tub and shower.  Do not use towel bars as grab bars. Use non-skid mats or decals in the tub or shower. If you need to sit down in the shower, use a plastic, non-slip stool. Keep the floor dry. Clean up any water that spills on the floor as soon as it happens. Remove soap buildup in the tub or shower regularly. Attach bath mats securely with double-sided non-slip rug tape. Do not have throw rugs and other things on the floor that can make you trip. What can I do in the bedroom? Use night lights. Make sure that you have a light by your bed that is easy to reach. Do not use any sheets or blankets that are too big for your bed. They should not hang down onto the floor. Have a firm chair that has side arms. You can use this for support while you get dressed. Do not have throw rugs and other things on the floor that can make you trip. What can I do in the kitchen? Clean up any spills right away. Avoid walking on wet floors. Keep items that you use a lot in easy-to-reach places. If you need to reach something above you,  use a strong step stool that has a grab bar. Keep electrical cords out of the way. Do not use floor polish or wax that makes floors slippery. If you must use wax, use non-skid floor wax. Do not have throw rugs and other things on the floor that can make you trip. What can I do with my stairs? Do not leave any items on the stairs. Make sure that there are handrails on both sides of the stairs and use them. Fix handrails that are broken or loose. Make sure that handrails are as long as the stairways. Check any carpeting to make sure that it is firmly attached to the stairs. Fix any carpet that is loose or worn. Avoid having throw rugs at the top or bottom of the stairs. If you do have throw rugs, attach them to the floor with carpet tape. Make sure that you have a light switch at the top of the stairs and the bottom of the stairs. If you do not have them, ask someone to add them for you. What else  can I do to help prevent falls? Wear shoes that: Do not have high heels. Have rubber bottoms. Are comfortable and fit you well. Are closed at the toe. Do not wear sandals. If you use a stepladder: Make sure that it is fully opened. Do not climb a closed stepladder. Make sure that both sides of the stepladder are locked into place. Ask someone to hold it for you, if possible. Clearly mark and make sure that you can see: Any grab bars or handrails. First and last steps. Where the edge of each step is. Use tools that help you move around (mobility aids) if they are needed. These include: Canes. Walkers. Scooters. Crutches. Turn on the lights when you go into a dark area. Replace any light bulbs as soon as they burn out. Set up your furniture so you have a clear path. Avoid moving your furniture around. If any of your floors are uneven, fix them. If there are any pets around you, be aware of where they are. Review your medicines with your doctor. Some medicines can make you feel dizzy. This can increase your chance of falling. Ask your doctor what other things that you can do to help prevent falls. This information is not intended to replace advice given to you by your health care provider. Make sure you discuss any questions you have with your health care provider. Document Released: 08/15/2009 Document Revised: 03/26/2016 Document Reviewed: 11/23/2014 Elsevier Interactive Patient Education  2017 [employer].

## 2022-11-18 NOTE — Progress Notes (Signed)
Subjective:   Patrick Cannon is a 67 y.o. male who presents for Medicare Annual/Subsequent preventive examination.  Review of Systems      Cardiac Risk Factors include: advanced age (>49mn, >>75women);male gender;hypertension;smoking/ tobacco exposure     Objective:    Today's Vitals   11/18/22 0940  BP: 120/60  Pulse: 72  Temp: 98.1 F (36.7 C)  TempSrc: Oral  SpO2: 97%  Weight: 231 lb 3.2 oz (104.9 kg)  Height: '5\' 10"'$  (1.778 m)   Body mass index is 33.17 kg/m.     11/18/2022    9:58 AM 10/07/2020   10:28 AM 07/14/2016    9:43 AM 06/21/2014    2:58 PM  Advanced Directives  Does Patient Have a Medical Advance Directive? No No Yes Yes  Type of Advance Directive    Living will  Does patient want to make changes to medical advance directive?    No - Patient declined  Copy of HHerseyin Chart?    No - copy requested  Would patient like information on creating a medical advance directive? No - Patient declined No - Patient declined      Current Medications (verified) Outpatient Encounter Medications as of 11/18/2022  Medication Sig   colchicine 0.6 MG tablet Take 1 tablet (0.6 mg total) by mouth daily as needed.   Multiple Vitamin (MULTIVITAMIN) tablet Take 1 tablet by mouth daily.   sildenafil (REVATIO) 20 MG tablet TAKE 2 TABLETS BY MOUTH DAILY AS NEEDED [facility]   triamcinolone cream (KENALOG) 0.1 % Apply 1 application topically 2 (two) times daily as needed. Left forearm.   No facility-administered encounter medications on file as of 11/18/2022.    Allergies (verified) Pravastatin   History: Past Medical History:  Diagnosis Date   Arthritis    Hyperlipidemia    no meds   Hypertension    no meds   Past Surgical History:  Procedure Laterality Date   APPENDECTOMY     age 67  COLONOSCOPY     TONSILLECTOMY     age 67  Family History  Problem Relation Age of Onset   Cancer Mother        Glyoblastoma    Cancer Father        Lung   Colon cancer Maternal Aunt    Diabetes Neg Hx    Hyperlipidemia Neg Hx    Hypertension Neg Hx    Rectal cancer Neg Hx    Stomach cancer Neg Hx    Social History   Socioeconomic History   Marital status: Divorced    Spouse name: Not on file   Number of children: Not on file   Years of education: Not on file   Highest education level: Bachelor's degree (e.g., BA, AB, BS)  Occupational History   Not on file  Tobacco Use   Smoking status: Every Day    Packs/day: 0.50    Years: 52.00    Total pack years: 26.00    Types: Cigarettes    Start date: 04/27/1970   Smokeless tobacco: Never  Vaping Use   Vaping Use: Never used  Substance and Sexual Activity   Alcohol use: Yes    Comment: almost daily   Drug use: No   Sexual activity: Not on file  Other Topics Concern   Not on file  Social History Narrative   Not on file   Social Determinants of Health   Financial Resource Strain:  Low Risk  (11/18/2022)   Overall Financial Resource Strain (CARDIA)    Difficulty of Paying Living Expenses: Not hard at all  Food Insecurity: No Food Insecurity (11/18/2022)   Hunger Vital Sign    Worried About Running Out of Food in the Last Year: Never true    Ran Out of Food in the Last Year: Never true  Transportation Needs: No Transportation Needs (11/18/2022)   PRAPARE - Hydrologist (Medical): No    Lack of Transportation (Non-Medical): No  Physical Activity: Sufficiently Active (11/18/2022)   Exercise Vital Sign    Days of Exercise per Week: 4 days    Minutes of Exercise per Session: 40 min  Stress: No Stress Concern Present (11/18/2022)   [place]    Feeling of Stress : Not at all  Social Connections: Socially Isolated (11/18/2022)   Social Connection and Isolation Panel [NHANES]    Frequency of Communication with Friends and Family: More than three times a week     Frequency of Social Gatherings with Friends and Family: More than three times a week    Attends Religious Services: Never    Marine scientist or Organizations: No    Attends Music therapist: Never    Marital Status: Divorced    Tobacco Counseling Ready to quit: No Counseling given: Yes   Clinical Intake:  Pre-visit preparation completed: Yes  Pain : No/denies pain     BMI - recorded: 33.17 Nutritional Status: BMI > 30  Obese Nutritional Risks: None Diabetes: No  How often do you need to have someone help you when you read instructions, pamphlets, or other written materials from your doctor or pharmacy?: 1 - Never  Diabetic?  No  Interpreter Needed?: No  Information entered by :: Rolene Arbour LPN   Activities of Daily Living    11/18/2022    9:57 AM 11/17/2022   10:25 AM  In your present state of health, do you have any difficulty performing the following activities:  Hearing? 0 0  Vision? 0 0  Difficulty concentrating or making decisions? 0 0  Walking or climbing stairs? 0 0  Dressing or bathing? 0 0  Doing errands, shopping? 0 0  Preparing Food and eating ? N N  Using the Toilet? N N  In the past six months, have you accidently leaked urine? N N  Do you have problems with loss of bowel control? N N  Managing your Medications? N N  Managing your Finances? N N  Housekeeping or managing your Housekeeping? N N    Patient Care Team: [place], Betty G, MD as PCP - General (Family Medicine) Frederik Pear, MD as Consulting Physician (Orthopedic Surgery)  Indicate any recent Medical Services you may have received from other than [facility] providers in the past year (date may be approximate).     Assessment:   This is a routine wellness examination for Patrick Cannon.  Hearing/Vision screen Hearing Screening - Comments:: Denies hearing difficulties   Vision Screening - Comments:: Wears rx glasses - up to date with routine eye exams with  [facility]  Dietary issues and exercise activities discussed: Current Exercise Habits: Home exercise routine, Type of exercise: walking, Time (Minutes): 40, Frequency (Times/Week): 4, Weekly Exercise (Minutes/Week): 160, Intensity: Moderate, Exercise limited by: None identified   Goals Addressed               This Visit's Progress  Lose Weight (pt-stated)        I want to lose about 30lbs.       Depression Screen    11/18/2022    9:39 AM 08/14/2022   10:35 AM 01/07/2022   10:30 AM 10/09/2019    7:28 AM 07/14/2016    9:44 AM  PHQ 2/9 Scores  PHQ - 2 Score 0 0 0 0 0    Fall Risk    11/18/2022    9:57 AM 11/17/2022   10:25 AM 08/14/2022   10:35 AM 05/31/2022    9:32 AM 01/07/2022   10:30 AM  Fall Risk   Falls in the past year? 0 0 0 0 0  Number falls in past yr: 0 0 0  0  Injury with Fall? 0 0 0  0  Risk for fall due to : No Fall Risks  No Fall Risks    Follow up Falls prevention discussed  Falls evaluation completed      FALL RISK PREVENTION PERTAINING TO THE HOME:  Any stairs in or around the home? Yes  If so, are there any without handrails? No  Home free of loose throw rugs in walkways, pet beds, electrical cords, etc? Yes  Adequate lighting in your home to reduce risk of falls? Yes   ASSISTIVE DEVICES UTILIZED TO PREVENT FALLS:  Life alert? No  Use of a cane, walker or w/c? No  Grab bars in the bathroom? No  Shower chair or bench in shower? No  Elevated toilet seat or a handicapped toilet? No   TIMED UP AND GO:  Was the test performed? Yes .  Length of time to ambulate 10 feet: 10 sec.   Gait steady and fast without use of assistive device  Cognitive Function:        11/18/2022    9:58 AM  6CIT Screen  What Year? 0 points  What month? 0 points  What time? 0 points  Count back from 20 0 points  Months in reverse 0 points  Repeat phrase 0 points  Total Score 0 points    Immunizations Immunization History  Administered Date(s) Administered    Fluad Quad(high Dose 65+) 09/23/2020, 08/14/2022   Influenza,inj,Quad PF,6+ Mos 09/28/2017, 10/09/2019   PFIZER(Purple Top)SARS-COV-2 Vaccination 10/07/2020   Pneumococcal Conjugate-13 09/23/2020   Pneumococcal Polysaccharide-23 01/07/2022   Unspecified SARS-COV-2 Vaccination 08/27/2022    TDAP status: Due, Education has been provided regarding the importance of this vaccine. Advised may receive this vaccine at local pharmacy or Health Dept. Aware to provide a copy of the vaccination record if obtained from local pharmacy or Health Dept. Verbalized acceptance and understanding.  Flu Vaccine status: Up to date  Pneumococcal vaccine status: Up to date  Covid-19 vaccine status: Completed vaccines  Qualifies for Shingles Vaccine? Yes   Zostavax completed No   Shingrix Completed?: No.    Education has been provided regarding the importance of this vaccine. Patient has been advised to call insurance company to determine out of pocket expense if they have not yet received this vaccine. Advised may also receive vaccine at local pharmacy or Health Dept. Verbalized acceptance and understanding.  Screening Tests Health Maintenance  Topic Date Due   DTaP/Tdap/Td (1 - Tdap) Never done   COVID-19 Vaccine (3 - 2023-24 season) 12/04/2022 (Originally 10/22/2022)   Zoster Vaccines- Shingrix (1 of 2) 02/17/2023 (Originally 04/27/2005)   Lung Cancer Screening  03/14/2023   Medicare Annual Wellness (AWV)  11/19/2023   COLONOSCOPY (Pts 45-47yr Insurance  coverage will need to be confirmed)  03/06/2027   Pneumonia Vaccine 27+ Years old  Completed   INFLUENZA VACCINE  Completed   Hepatitis C Screening  Completed   HPV VACCINES  Aged Out    Health Maintenance  Health Maintenance Due  Topic Date Due   DTaP/Tdap/Td (1 - Tdap) Never done    Colorectal cancer screening: Type of screening: Colonoscopy. Completed 03/05/22. Repeat every 5 years  Lung Cancer Screening: (Low Dose CT Chest recommended if Age  17-80 years, 30 pack-year currently smoking OR have quit w/in 15years.) does qualify.   Lung Cancer Screening Referral: Deferred  Additional Screening:  Hepatitis C Screening: does qualify; Completed 09/28/17  Vision Screening: Recommended annual ophthalmology exams for early detection of glaucoma and other disorders of the eye. Is the patient up to date with their annual eye exam?  Yes  Who is the provider or what is the name of the office in which the patient attends annual eye exams? [place] If pt is not established with a provider, would they like to be referred to a provider to establish care? No .   Dental Screening: Recommended annual dental exams for proper oral hygiene  Community Resource Referral / Chronic Care Management:  CRR required this visit?  No   CCM required this visit?  No      Plan:     I have personally reviewed and noted the following in the patient's chart:   Medical and social history Use of alcohol, tobacco or illicit drugs  Current medications and supplements including opioid prescriptions. Patient is not currently taking opioid prescriptions. Functional ability and status Nutritional status Physical activity Advanced directives List of other physicians Hospitalizations, surgeries, and ER visits in previous 12 months Vitals Screenings to include cognitive, depression, and falls Referrals and appointments  In addition, I have reviewed and discussed with patient certain preventive protocols, quality metrics, and best practice recommendations. A written personalized care plan for preventive services as well as general preventive health recommendations were provided to patient.     Criselda Peaches, LPN   7/00/1749   Nurse Notes: None

## 2023-01-08 DIAGNOSIS — E755 Other lipid storage disorders: Secondary | ICD-10-CM | POA: Diagnosis not present

## 2023-02-24 ENCOUNTER — Other Ambulatory Visit: Payer: Self-pay | Admitting: Acute Care

## 2023-02-24 DIAGNOSIS — Z122 Encounter for screening for malignant neoplasm of respiratory organs: Secondary | ICD-10-CM

## 2023-02-24 DIAGNOSIS — Z87891 Personal history of nicotine dependence: Secondary | ICD-10-CM

## 2023-02-24 DIAGNOSIS — F17210 Nicotine dependence, cigarettes, uncomplicated: Secondary | ICD-10-CM

## 2023-03-11 IMAGING — CT CT CHEST LUNG CANCER SCREENING LOW DOSE W/O CM
2 of 5 series · 15 of 40 positions shown, 18 images · non-contrast
Comparison: None Available.

CLINICAL DATA: Lung cancer screening. Current smoker. Twenty-six
pack-year history. Asymptomatic.



[Series 4: lung 1.00 br44 cor · coronal · 0.66mm/px · 3 of 357 slices shown]
[im 72/357  lung]
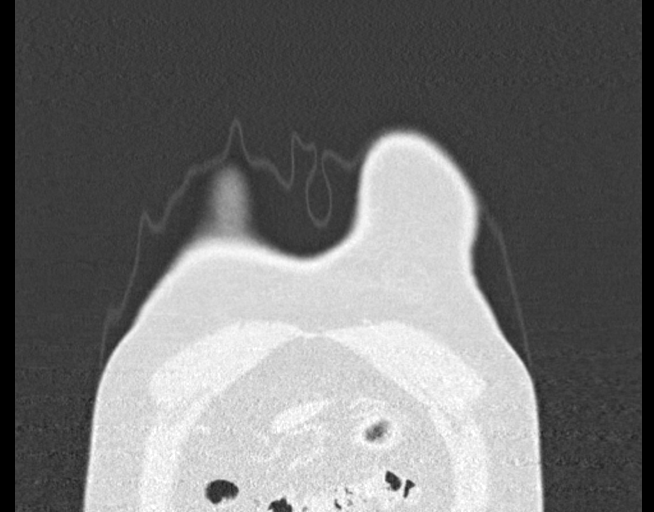
[im 143/357  lung]
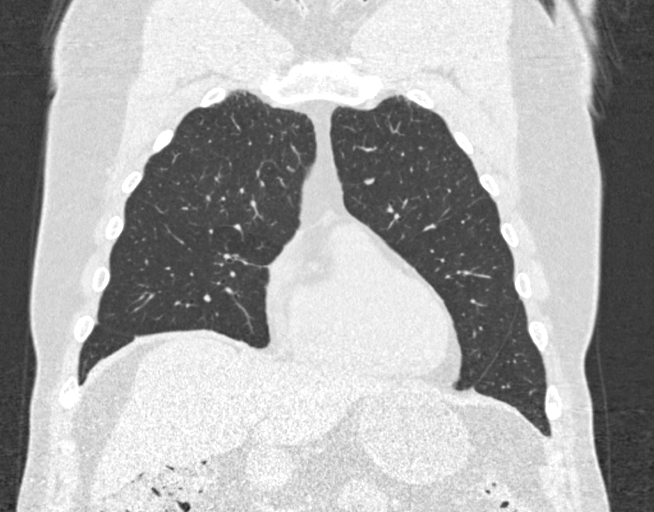
[im 214/357  lung]
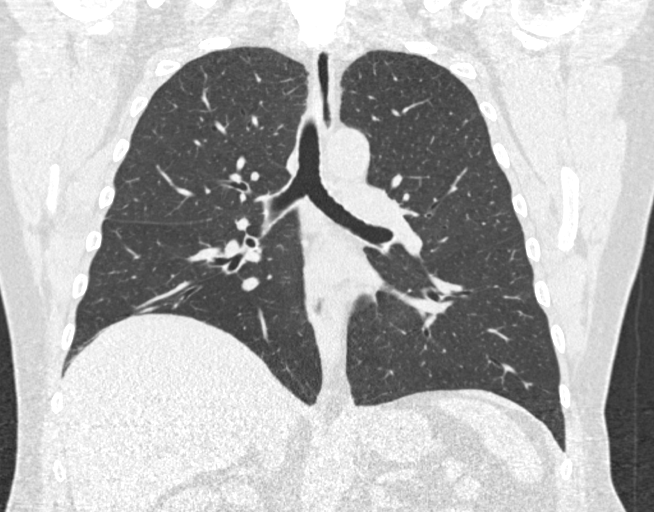

[Series 9: lung 1.00 br60 axial · axial · 0.78mm/px · z∈[-1338,-1035]mm · 12 of 335 slices shown, 15 images]
[im 16/335  mediastinal]
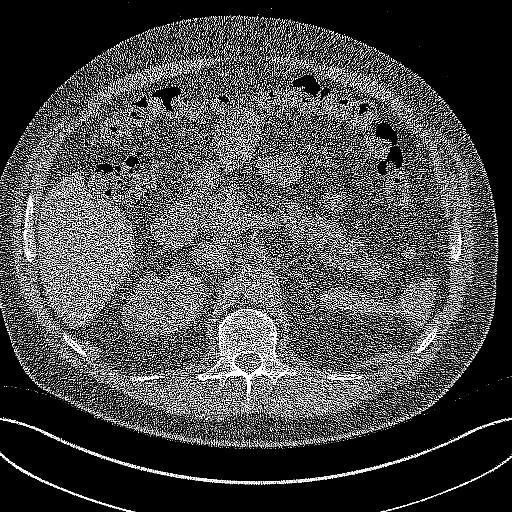
[im 16/335  lung]
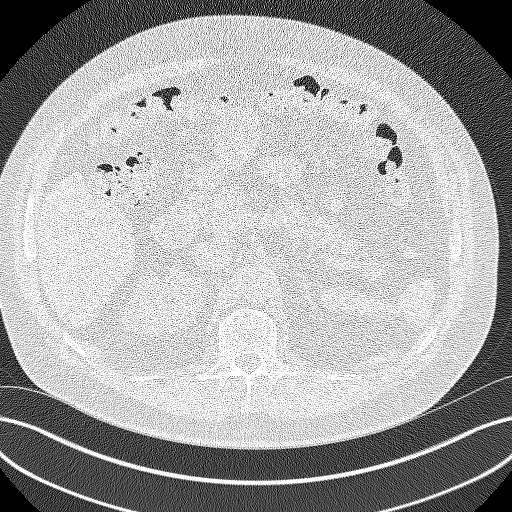
[im 46/335  lung]
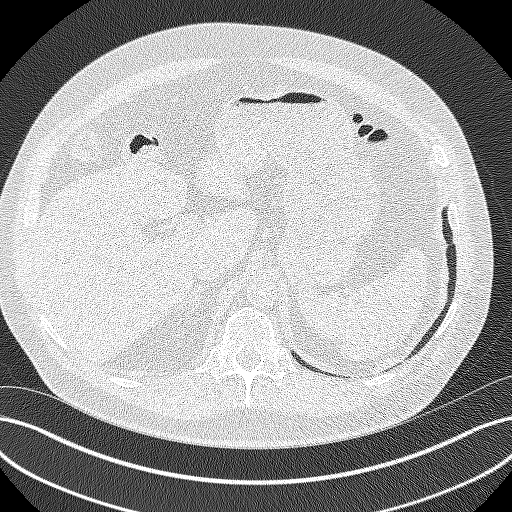
[im 76/335  lung]
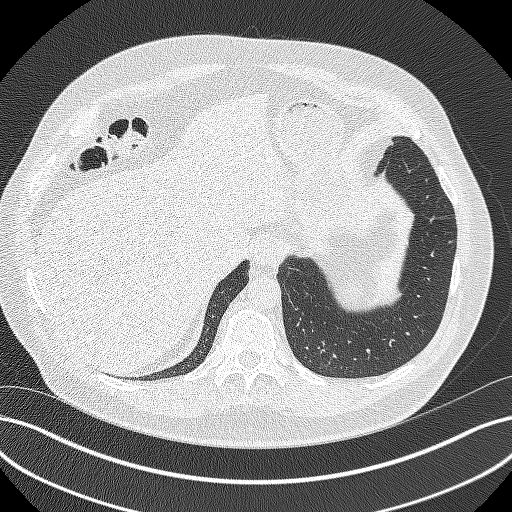
[im 107/335  lung]
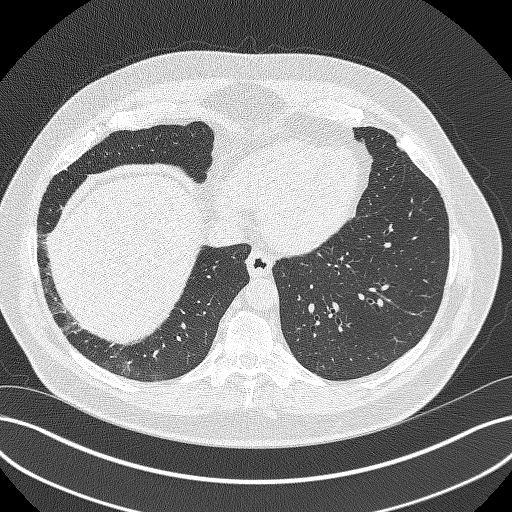
[im 122/335  mediastinal]
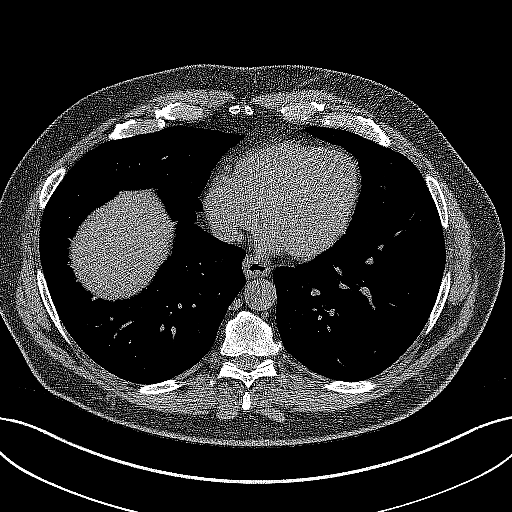
[im 122/335  lung]
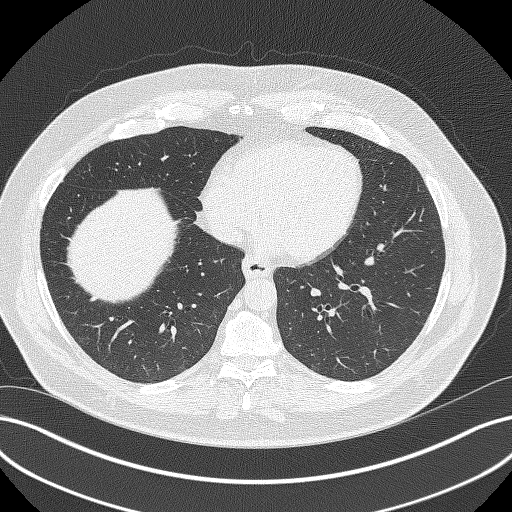
[im 152/335  lung]
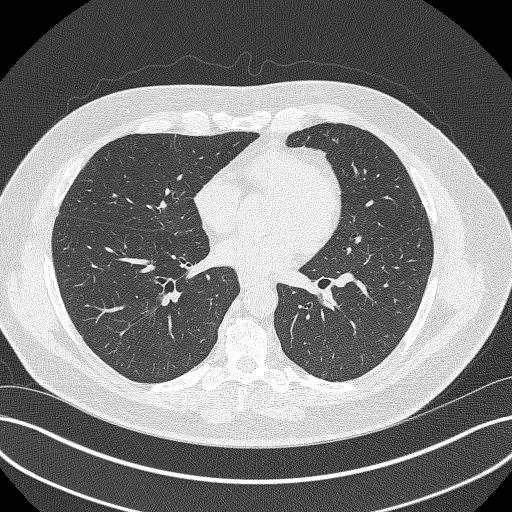
[im 183/335  lung]
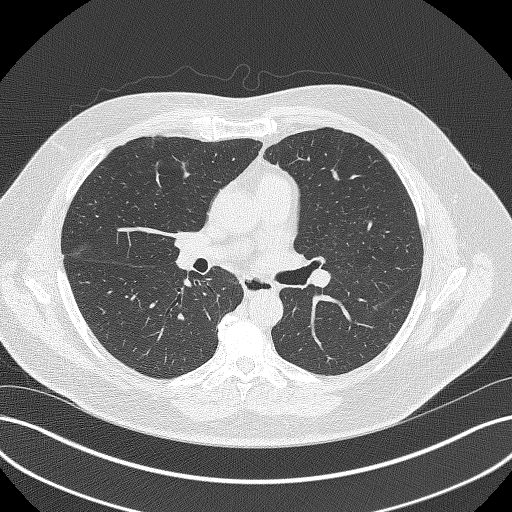
[im 213/335  lung]
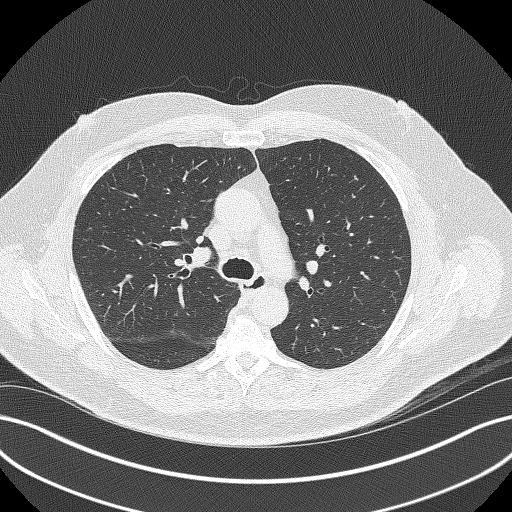
[im 228/335  mediastinal]
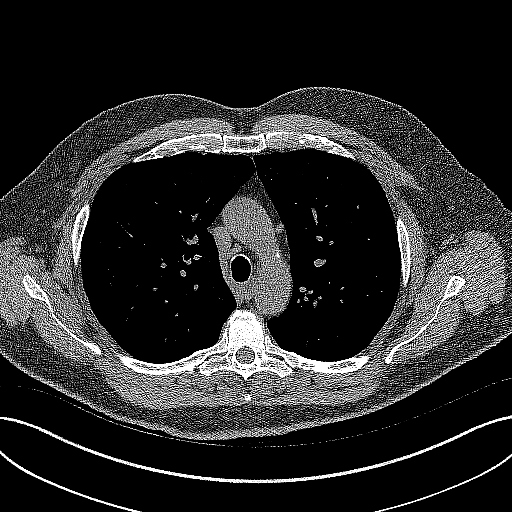
[im 228/335  lung]
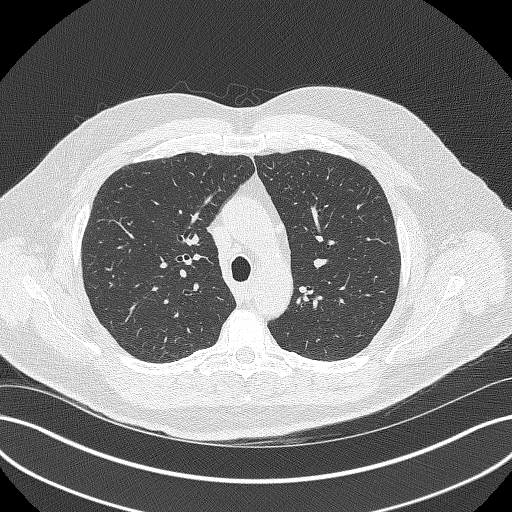
[im 259/335  lung]
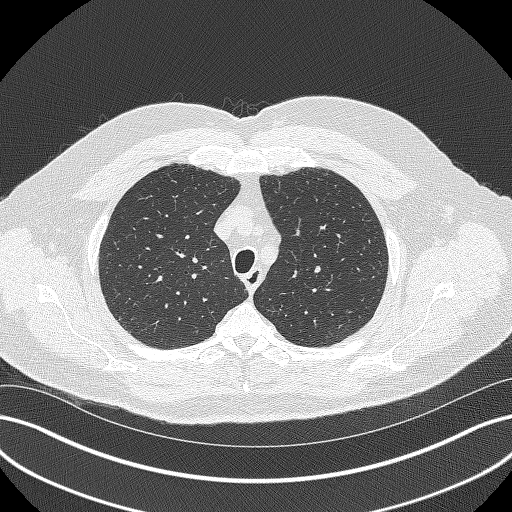
[im 289/335  lung]
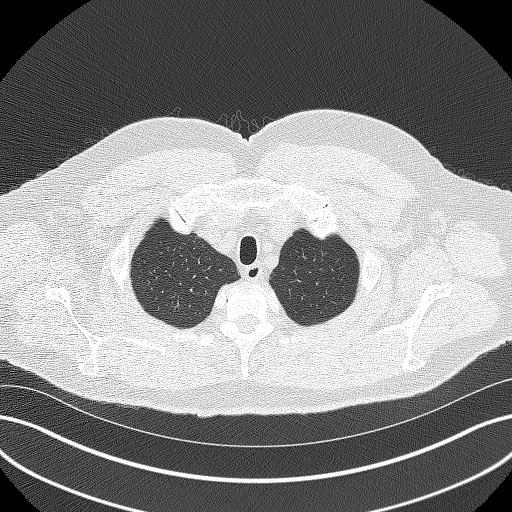
[im 319/335  lung]
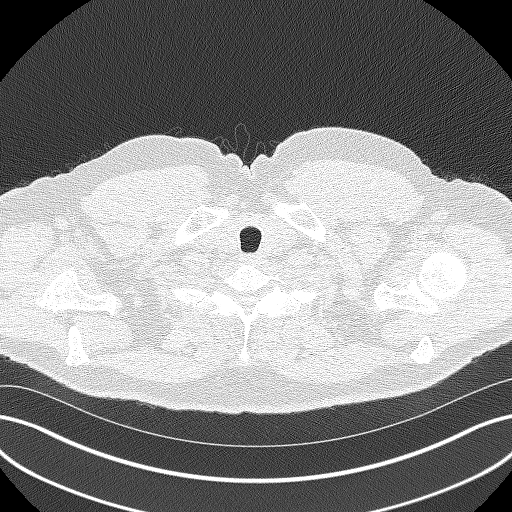

[15 of 40 positions shown; findings below may reference images not displayed]

FINDINGS: Cardiovascular: The heart size is within normal limits. No
pericardial effusion. Lad coronary artery atherosclerotic
calcifications.

Mediastinum/Nodes: No enlarged mediastinal, hilar, or axillary lymph
nodes. Thyroid gland, trachea, and esophagus demonstrate no
significant findings.

Lungs/Pleura: No pleural effusion, airspace consolidation, or
atelectasis. Scattered postinflammatory parenchymal bands noted in
the right middle lobe and right lower lobe. There is a single tiny
nodule within the right upper lobe with a mean derived diameter of
2.2 mm. No suspicious lung nodules identified at this time.

Upper Abdomen: No acute abnormality.

Musculoskeletal: Multilevel degenerative disc disease noted within
the thoracic spine. No suspicious or acute osseous findings.
IMPRESSION: 1. Lung-RADS 2, benign appearance or behavior. Continue annual
screening with low-dose chest CT without contrast in 12 months.
2. Coronary artery calcifications.

## 2023-03-23 ENCOUNTER — Ambulatory Visit
Admission: RE | Admit: 2023-03-23 | Discharge: 2023-03-23 | Disposition: A | Discharge status: Home / Self Care | Payer: Medicare HMO | Source: Ambulatory Visit | Attending: Family Medicine | Admitting: Family Medicine

## 2023-03-23 DIAGNOSIS — F17210 Nicotine dependence, cigarettes, uncomplicated: Secondary | ICD-10-CM

## 2023-03-23 DIAGNOSIS — J439 Emphysema, unspecified: Secondary | ICD-10-CM | POA: Diagnosis not present

## 2023-03-23 DIAGNOSIS — Z122 Encounter for screening for malignant neoplasm of respiratory organs: Secondary | ICD-10-CM

## 2023-03-23 DIAGNOSIS — K760 Fatty (change of) liver, not elsewhere classified: Secondary | ICD-10-CM | POA: Diagnosis not present

## 2023-03-23 DIAGNOSIS — Z87891 Personal history of nicotine dependence: Secondary | ICD-10-CM

## 2023-03-23 DIAGNOSIS — I700 Atherosclerosis of aorta: Secondary | ICD-10-CM | POA: Diagnosis not present

## 2023-03-26 ENCOUNTER — Other Ambulatory Visit: Payer: Self-pay | Admitting: Acute Care

## 2023-03-26 DIAGNOSIS — F17210 Nicotine dependence, cigarettes, uncomplicated: Secondary | ICD-10-CM

## 2023-03-26 DIAGNOSIS — Z122 Encounter for screening for malignant neoplasm of respiratory organs: Secondary | ICD-10-CM

## 2023-03-26 DIAGNOSIS — Z87891 Personal history of nicotine dependence: Secondary | ICD-10-CM

## 2023-11-30 DIAGNOSIS — H2513 Age-related nuclear cataract, bilateral: Secondary | ICD-10-CM | POA: Diagnosis not present

## 2023-12-01 ENCOUNTER — Ambulatory Visit: Payer: Medicare HMO

## 2023-12-01 ENCOUNTER — Encounter: Payer: Self-pay | Admitting: Family Medicine

## 2023-12-01 VITALS — BP 118/64 | HR 72 | Temp 98.1°F | Ht 69.5 in | Wt 227.0 lb

## 2023-12-01 DIAGNOSIS — Z0000 Encounter for general adult medical examination without abnormal findings: Secondary | ICD-10-CM | POA: Diagnosis not present

## 2023-12-01 DIAGNOSIS — N529 Male erectile dysfunction, unspecified: Secondary | ICD-10-CM

## 2023-12-01 MED ORDER — SILDENAFIL CITRATE 20 MG PO TABS: ORAL_TABLET | ORAL | 0 refills | Status: DC

## 2023-12-01 NOTE — Progress Notes (Cosign Needed Addendum)
Subjective:   Patrick Cannon is a 68 y.o. male who presents for Medicare Annual/Subsequent preventive examination.  Visit Complete: In person  Patient Medicare AWV questionnaire was completed by the patient on 11/28/23; I have confirmed that all information answered by patient is correct and no changes since this date.  Cardiac Risk Factors include: advanced age (>28men, >74 women);male gender;hypertension;smoking/ tobacco exposure     Objective:    Today's Vitals   12/01/23 1539  BP: 118/64  Pulse: 72  Temp: 98.1 F (36.7 C)  TempSrc: Oral  SpO2: 96%  Weight: 227 lb (103 kg)  Height: 5' 9.5" (1.765 m)   Body mass index is 33.04 kg/m.     12/01/2023    3:59 PM 11/18/2022    9:58 AM 10/07/2020   10:28 AM 07/14/2016    9:43 AM 06/21/2014    2:58 PM  Advanced Directives  Does Patient Have a Medical Advance Directive? No No No Yes Yes  Type of Advance Directive     Living will  Does patient want to make changes to medical advance directive?     No - Patient declined  Copy of Healthcare Power of Attorney in Chart?     No - copy requested  Would patient like information on creating a medical advance directive? No - Patient declined No - Patient declined No - Patient declined      Current Medications (verified) Outpatient Encounter Medications as of 12/01/2023  Medication Sig   colchicine 0.6 MG tablet Take 1 tablet (0.6 mg total) by mouth daily as needed.   Multiple Vitamin (MULTIVITAMIN) tablet Take 1 tablet by mouth daily.   sildenafil (REVATIO) 20 MG tablet TAKE 2 TABLETS BY MOUTH DAILY AS NEEDED **CHEAPEST AT [facility]**   triamcinolone cream (KENALOG) 0.1 % Apply 1 application topically 2 (two) times daily as needed. Left forearm.   No facility-administered encounter medications on file as of 12/01/2023.    Allergies (verified) Pravastatin   History: Past Medical History:  Diagnosis Date   Arthritis    Hyperlipidemia    no meds   Hypertension    no meds    Past Surgical History:  Procedure Laterality Date   APPENDECTOMY     age 28   COLONOSCOPY     TONSILLECTOMY     age 45   Family History  Problem Relation Age of Onset   Cancer Mother        Glyoblastoma   Cancer Father        Lung   Colon cancer Maternal Aunt    Diabetes Neg Hx    Hyperlipidemia Neg Hx    Hypertension Neg Hx    Rectal cancer Neg Hx    Stomach cancer Neg Hx    Social History   Socioeconomic History   Marital status: Divorced    Spouse name: Not on file   Number of children: Not on file   Years of education: Not on file   Highest education level: Bachelor's degree (e.g., BA, AB, BS)  Occupational History   Not on file  Tobacco Use   Smoking status: Every Day    Current packs/day: 0.50    Average packs/day: 0.5 packs/day for 53.6 years (26.8 ttl pk-yrs)    Types: Cigarettes    Start date: 04/27/1970   Smokeless tobacco: Never  Vaping Use   Vaping status: Never Used  Substance and Sexual Activity   Alcohol use: Yes    Comment: almost daily  Drug use: No   Sexual activity: Not on file  Other Topics Concern   Not on file  Social History Narrative   Not on file   Social Drivers of Health   Financial Resource Strain: Low Risk  (12/01/2023)   Overall Financial Resource Strain (CARDIA)    Difficulty of Paying Living Expenses: Not hard at all  Food Insecurity: No Food Insecurity (12/01/2023)   Hunger Vital Sign    Worried About Running Out of Food in the Last Year: Never true    Ran Out of Food in the Last Year: Never true  Transportation Needs: No Transportation Needs (12/01/2023)   PRAPARE - Administrator, Civil Service (Medical): No    Lack of Transportation (Non-Medical): No  Physical Activity: Sufficiently Active (12/01/2023)   Exercise Vital Sign    Days of Exercise per Week: 5 days    Minutes of Exercise per Session: 50 min  Stress: No Stress Concern Present (12/01/2023)   [employer] of Occupational Health -  Occupational Stress Questionnaire    Feeling of Stress : Not at all  Social Connections: Moderately Isolated (12/01/2023)   Social Connection and Isolation Panel [NHANES]    Frequency of Communication with Friends and Family: More than three times a week    Frequency of Social Gatherings with Friends and Family: More than three times a week    Attends Religious Services: Never    Database administrator or Organizations: Yes    Attends Engineer, structural: More than 4 times per year    Marital Status: Divorced    Tobacco Counseling Ready to quit: Yes Counseling given: Yes   Clinical Intake:  Pre-visit preparation completed: Yes  Pain : No/denies pain     BMI - recorded: 33.04 Nutritional Status: BMI > 30  Obese Nutritional Risks: None Diabetes: No  How often do you need to have someone help you when you read instructions, pamphlets, or other written materials from your doctor or pharmacy?: 1 - Never  Interpreter Needed?: No  Information entered by :: Theresa Mulligan LPN   Activities of Daily Living    12/01/2023    3:57 PM 11/28/2023   11:37 AM  In your present state of health, do you have any difficulty performing the following activities:  Hearing? 0 0  Vision? 0 0  Difficulty concentrating or making decisions? 0 0  Walking or climbing stairs? 0 0  Dressing or bathing? 0 0  Doing errands, shopping? 0 0  Preparing Food and eating ? N N  Using the Toilet? N N  In the past six months, have you accidently leaked urine? N N  Do you have problems with loss of bowel control? N N  Managing your Medications? N N  Managing your Finances? N N  Housekeeping or managing your Housekeeping? N N    Patient Care Team: [place], Betty G, MD as PCP - General (Family Medicine) Gean Birchwood, MD as Consulting Physician (Orthopedic Surgery)  Indicate any recent Medical Services you may have received from other than [facility] providers in the past year (date may be  approximate).     Assessment:   This is a routine wellness examination for Patrick Cannon.  Hearing/Vision screen Hearing Screening - Comments:: Denies hearing difficulties   Vision Screening - Comments:: Wears rx glasses - up to date with routine eye exams with  [facility]   Goals Addressed  This Visit's Progress     Stay Active (pt-stated)        Lose weight       Depression Screen    12/01/2023    3:56 PM 11/18/2022    9:39 AM 08/14/2022   10:35 AM 01/07/2022   10:30 AM 10/09/2019    7:28 AM 07/14/2016    9:44 AM  PHQ 2/9 Scores  PHQ - 2 Score 0 0 0 0 0 0    Fall Risk    12/01/2023    3:57 PM 11/28/2023   11:37 AM 11/18/2022    9:57 AM 11/17/2022   10:25 AM 08/14/2022   10:35 AM  Fall Risk   Falls in the past year? 0 1 0 0 0  Number falls in past yr: 0 0 0 0 0  Injury with Fall? 0 0 0 0 0  Risk for fall due to : No Fall Risks  No Fall Risks  No Fall Risks  Follow up Falls prevention discussed  Falls prevention discussed  Falls evaluation completed    MEDICARE RISK AT HOME: Medicare Risk at Home Any stairs in or around the home?: Yes If so, are there any without handrails?: No Home free of loose throw rugs in walkways, pet beds, electrical cords, etc?: Yes Adequate lighting in your home to reduce risk of falls?: Yes Life alert?: No Use of a cane, walker or w/c?: No Grab bars in the bathroom?: No Shower chair or bench in shower?: No Elevated toilet seat or a handicapped toilet?: No  TIMED UP AND GO:  Was the test performed?  Yes  Length of time to ambulate 10 feet: 10 sec Gait steady and fast without use of assistive device    Cognitive Function:        12/01/2023    4:00 PM 11/18/2022    9:58 AM  6CIT Screen  What Year? 0 points 0 points  What month? 0 points 0 points  What time? 0 points 0 points  Count back from 20 0 points 0 points  Months in reverse 0 points 0 points  Repeat phrase 0 points 0 points  Total Score 0 points 0 points     Immunizations Immunization History  Administered Date(s) Administered   Fluad Quad(high Dose 65+) 09/23/2020, 08/14/2022   Influenza,inj,Quad PF,6+ Mos 09/28/2017, 10/09/2019   Influenza-Unspecified 11/17/2023   PFIZER(Purple Top)SARS-COV-2 Vaccination 10/07/2020   Pneumococcal Conjugate-13 09/23/2020   Pneumococcal Polysaccharide-23 01/07/2022   Unspecified SARS-COV-2 Vaccination 08/27/2022    TDAP status: Due, Education has been provided regarding the importance of this vaccine. Advised may receive this vaccine at local pharmacy or Health Dept. Aware to provide a copy of the vaccination record if obtained from local pharmacy or Health Dept. Verbalized acceptance and understanding.  Flu Vaccine status: Up to date  Pneumococcal vaccine status: Up to date  Covid-19 vaccine status: Declined, Education has been provided regarding the importance of this vaccine but patient still declined. Advised may receive this vaccine at local pharmacy or Health Dept.or vaccine clinic. Aware to provide a copy of the vaccination record if obtained from local pharmacy or Health Dept. Verbalized acceptance and understanding.  Qualifies for Shingles Vaccine? Yes   Zostavax completed No   Shingrix Completed?: No.    Education has been provided regarding the importance of this vaccine. Patient has been advised to call insurance company to determine out of pocket expense if they have not yet received this vaccine. Advised may also receive vaccine at local  pharmacy or Health Dept. Verbalized acceptance and understanding.  Screening Tests Health Maintenance  Topic Date Due   DTaP/Tdap/Td (1 - Tdap) Never done   Zoster Vaccines- Shingrix (1 of 2) Never done   COVID-19 Vaccine (3 - 2024-25 season) 07/04/2023   Lung Cancer Screening  03/22/2024   Medicare Annual Wellness (AWV)  11/30/2024   Colonoscopy  03/06/2027   Pneumonia Vaccine 78+ Years old  Completed   INFLUENZA VACCINE  Completed   Hepatitis C  Screening  Completed   HPV VACCINES  Aged Out    Health Maintenance  Health Maintenance Due  Topic Date Due   DTaP/Tdap/Td (1 - Tdap) Never done   Zoster Vaccines- Shingrix (1 of 2) Never done   COVID-19 Vaccine (3 - 2024-25 season) 07/04/2023    Colorectal cancer screening: Type of screening: Colonoscopy. Completed 03/05/22. Repeat every 5 years  Lung Cancer Screening: (Low Dose CT Chest recommended if Age 38-80 years, 20 pack-year currently smoking OR have quit w/in 15years.) does qualify. Completed 03/23/23  Additional Screening:  Hepatitis C Screening: does qualify; Completed 09/28/17  Vision Screening: Recommended annual ophthalmology exams for early detection of glaucoma and other disorders of the eye. Is the patient up to date with their annual eye exam?  Yes  Who is the provider or what is the name of the office in which the patient attends annual eye exams? [facility] If pt is not established with a provider, would they like to be referred to a provider to establish care? No .   Dental Screening: Recommended annual dental exams for proper oral hygiene    Community Resource Referral / Chronic Care Management:  CRR required this visit?  No   CCM required this visit?  No     Plan:     I have personally reviewed and noted the following in the patient's chart:   Medical and social history Use of alcohol, tobacco or illicit drugs  Current medications and supplements including opioid prescriptions. Patient is not currently taking opioid prescriptions. Functional ability and status Nutritional status Physical activity Advanced directives List of other physicians Hospitalizations, surgeries, and ER visits in previous 12 months Vitals Screenings to include cognitive, depression, and falls Referrals and appointments  In addition, I have reviewed and discussed with patient certain preventive protocols, quality metrics, and best practice recommendations. A written  personalized care plan for preventive services as well as general preventive health recommendations were provided to patient.     Tillie Rung, LPN   1/61/0960   After Visit Summary: (In Person-Printed) AVS printed and given to the patient  Nurse Notes: None

## 2023-12-01 NOTE — Patient Instructions (Addendum)
Patrick Cannon , Thank you for taking time to come for your Medicare Wellness Visit. I appreciate your ongoing commitment to your health goals. Please review the following plan we discussed and let me know if I can assist you in the future.   Referrals/Orders/Follow-Ups/Clinician Recommendations:   This is a list of the screening recommended for you and due dates:  Health Maintenance  Topic Date Due   DTaP/Tdap/Td vaccine (1 - Tdap) Never done   Zoster (Shingles) Vaccine (1 of 2) Never done   COVID-19 Vaccine (3 - 2024-25 season) 07/04/2023   Screening for Lung Cancer  03/22/2024   Medicare Annual Wellness Visit  11/30/2024   Colon Cancer Screening  03/06/2027   Pneumonia Vaccine  Completed   Flu Shot  Completed   Hepatitis C Screening  Completed   HPV Vaccine  Aged Out    Advanced directives: (Provided) Advance directive discussed with you today. I have provided a copy for you to complete at home and have notarized. Once this is complete, please bring a copy in to our office so we can scan it into your chart.   Next Medicare Annual Wellness Visit scheduled for next year: Yes

## 2023-12-14 ENCOUNTER — Ambulatory Visit (INDEPENDENT_AMBULATORY_CARE_PROVIDER_SITE_OTHER): Payer: Medicare HMO | Admitting: Family Medicine

## 2023-12-14 ENCOUNTER — Encounter: Payer: Self-pay | Admitting: Family Medicine

## 2023-12-14 VITALS — BP 128/80 | HR 67 | Temp 98.0°F | Resp 16 | Ht 69.5 in | Wt 223.0 lb

## 2023-12-14 DIAGNOSIS — N529 Male erectile dysfunction, unspecified: Secondary | ICD-10-CM | POA: Diagnosis not present

## 2023-12-14 DIAGNOSIS — E785 Hyperlipidemia, unspecified: Secondary | ICD-10-CM

## 2023-12-14 DIAGNOSIS — H903 Sensorineural hearing loss, bilateral: Secondary | ICD-10-CM

## 2023-12-14 DIAGNOSIS — R351 Nocturia: Secondary | ICD-10-CM | POA: Diagnosis not present

## 2023-12-14 DIAGNOSIS — N401 Enlarged prostate with lower urinary tract symptoms: Secondary | ICD-10-CM | POA: Diagnosis not present

## 2023-12-14 DIAGNOSIS — R7303 Prediabetes: Secondary | ICD-10-CM | POA: Diagnosis not present

## 2023-12-14 DIAGNOSIS — I10 Essential (primary) hypertension: Secondary | ICD-10-CM | POA: Diagnosis not present

## 2023-12-14 DIAGNOSIS — L219 Seborrheic dermatitis, unspecified: Secondary | ICD-10-CM | POA: Diagnosis not present

## 2023-12-14 DIAGNOSIS — H9193 Unspecified hearing loss, bilateral: Secondary | ICD-10-CM | POA: Insufficient documentation

## 2023-12-14 DIAGNOSIS — Z0000 Encounter for general adult medical examination without abnormal findings: Secondary | ICD-10-CM | POA: Diagnosis not present

## 2023-12-14 DIAGNOSIS — I700 Atherosclerosis of aorta: Secondary | ICD-10-CM | POA: Insufficient documentation

## 2023-12-14 DIAGNOSIS — K760 Fatty (change of) liver, not elsewhere classified: Secondary | ICD-10-CM | POA: Diagnosis not present

## 2023-12-14 DIAGNOSIS — M109 Gout, unspecified: Secondary | ICD-10-CM

## 2023-12-14 DIAGNOSIS — F17200 Nicotine dependence, unspecified, uncomplicated: Secondary | ICD-10-CM

## 2023-12-14 LAB — URINALYSIS, ROUTINE W REFLEX MICROSCOPIC
Bilirubin Urine: NEGATIVE
Hgb urine dipstick: NEGATIVE
Ketones, ur: NEGATIVE
Leukocytes,Ua: NEGATIVE
Nitrite: NEGATIVE
RBC / HPF: NONE SEEN (ref 0–?)
Specific Gravity, Urine: 1.01 (ref 1.000–1.030)
Total Protein, Urine: NEGATIVE
Urine Glucose: NEGATIVE
Urobilinogen, UA: 0.2 (ref 0.0–1.0)
WBC, UA: NONE SEEN (ref 0–?)
pH: 6.5 (ref 5.0–8.0)

## 2023-12-14 LAB — BASIC METABOLIC PANEL
BUN: 16 mg/dL (ref 6–23)
CO2: 29 meq/L (ref 19–32)
Calcium: 9.6 mg/dL (ref 8.4–10.5)
Chloride: 102 meq/L (ref 96–112)
Creatinine, Ser: 1.12 mg/dL (ref 0.40–1.50)
GFR: 67.44 mL/min (ref >60.00)
Glucose, Bld: 108 mg/dL — ABNORMAL HIGH (ref 70–99)
Potassium: 4.3 meq/L (ref 3.5–5.1)
Sodium: 139 meq/L (ref 135–145)

## 2023-12-14 LAB — HEPATIC FUNCTION PANEL
ALT: 23 U/L (ref 0–53)
AST: 24 U/L (ref 0–37)
Albumin: 4.5 g/dL (ref 3.5–5.2)
Alkaline Phosphatase: 66 U/L (ref 39–117)
Bilirubin, Direct: 0.2 mg/dL (ref 0.0–0.3)
Total Bilirubin: 0.9 mg/dL (ref 0.2–1.2)
Total Protein: 7.8 g/dL (ref 6.0–8.3)

## 2023-12-14 LAB — LIPID PANEL
Cholesterol: 214 mg/dL — ABNORMAL HIGH (ref 0–200)
HDL: 55.5 mg/dL (ref >39.00)
LDL Cholesterol: 123 mg/dL — ABNORMAL HIGH (ref 0–99)
NonHDL: 158.56
Total CHOL/HDL Ratio: 4
Triglycerides: 176 mg/dL — ABNORMAL HIGH (ref 0.0–149.0)
VLDL: 35.2 mg/dL (ref 0.0–40.0)

## 2023-12-14 LAB — HEMOGLOBIN A1C: Hgb A1c MFr Bld: 6.1 % (ref 4.6–6.5)

## 2023-12-14 LAB — PSA: PSA: 2.67 ng/mL (ref 0.10–4.00)

## 2023-12-14 LAB — URIC ACID: Uric Acid, Serum: 7.7 mg/dL (ref 4.0–7.8)

## 2023-12-14 MED ORDER — SILDENAFIL CITRATE 20 MG PO TABS: ORAL_TABLET | ORAL | 4 refills | Status: AC

## 2023-12-14 MED ORDER — CLOTRIMAZOLE-BETAMETHASONE 1-0.05 % EX CREA: 1.0000 | TOPICAL_CREAM | Freq: Two times a day (BID) | CUTANEOUS | 1 refills | Status: AC | PRN

## 2023-12-14 MED ORDER — VARENICLINE TARTRATE 1 MG PO TABS: ORAL_TABLET | ORAL | 0 refills | Status: AC

## 2023-12-14 NOTE — Assessment & Plan Note (Signed)
Interested in smoking cessation. He tried chantix a few years ago and would like to try again. Lung cancer screening due in 03/2024.

## 2023-12-14 NOTE — Assessment & Plan Note (Signed)
Stable, having about an attack for year. Continue colchicine 0.6 mg twice daily as needed. Low purine diet to continue.

## 2023-12-14 NOTE — Assessment & Plan Note (Signed)
R>L. Recommend audiologist consultation for hearing aid fitting.  Hearing Screening   500Hz  1000Hz  2000Hz  4000Hz   Right ear Fail Fail Pass Fail  Left ear Pass Pass Pass Fail

## 2023-12-14 NOTE — Progress Notes (Signed)
HPI: Patrick Cannon is a 68 y.o.male with a PMHx significant for HTN, HLD, ED, prediabetes, and OA, who is here today for his routine physical examination and follow up.  Last CPE and follow up: 01/07/2022  Exercise: He does weightlifting or cardio work for 45-60 minutes 5x per week.  Diet: He mainly cooks at home and eats vegetables daily. He doesn't eat very much red meat. He snacks on pretzels and Cheez-It's Sleep: 7 hours per night.  Alcohol Use: He has 2-3 beers per day.  Smoking: He smokes 1/2 ppd. He has lung cancer screening every May. He is interested in trying to quit.  Vision: UTD on routine vision care Dental: UTD on routine dental care.   Immunization History  Administered Date(s) Administered   Fluad Quad(high Dose 65+) 09/23/2020, 08/14/2022   Influenza,inj,Quad PF,6+ Mos 09/28/2017, 10/09/2019   Influenza-Unspecified 11/17/2023   PFIZER(Purple Top)SARS-COV-2 Vaccination 10/07/2020   Pneumococcal Conjugate-13 09/23/2020   Pneumococcal Polysaccharide-23 01/07/2022   Unspecified SARS-COV-2 Vaccination 08/27/2022   Health Maintenance  Topic Date Due   DTaP/Tdap/Td (1 - Tdap) Never done   Zoster Vaccines- Shingrix (1 of 2) Never done   COVID-19 Vaccine (3 - 2024-25 season) 07/04/2023   Lung Cancer Screening  03/22/2024   Medicare Annual Wellness (AWV)  11/30/2024   Colonoscopy  03/06/2027   Pneumonia Vaccine 58+ Years old  Completed   INFLUENZA VACCINE  Completed   Hepatitis C Screening  Completed   HPV VACCINES  Aged Out   Prostate cancer screening:  Denies frequent nocturia but says he has some difficulty starting to urinate in the mornings.  No gross hematuria or changes in urinary frequency.  Lab Results  Component Value Date   PSA 2.00 01/07/2022   PSA 2.24 09/23/2020   PSA 1.81 10/09/2019   Chronic medical problems:   Gout:  He has a gout attack about once per year.  Currently on cochicine 0.6 mg daily prn.  Lab Results  Component Value Date    LABURIC 8.3 (H) 01/07/2022   Hypertension:  Medications: Not currently on pharmacologic treatment.  He occasionally checks his BP at [facility], but not regularly.  Negative for unusual or severe headache, visual changes, exertional chest pain, dyspnea, focal weakness, or edema.  Lab Results  Component Value Date   CREATININE 1.07 01/07/2022   BUN 13 01/07/2022   NA 138 01/07/2022   K 4.2 01/07/2022   CL 103 01/07/2022   CO2 26 01/07/2022   Hyperlipidemia: Not currently on pharmacologic treatment.  Lab Results  Component Value Date   CHOL 241 (H) 01/07/2022   HDL 51.70 01/07/2022   LDLCALC 154 (H) 01/07/2022   TRIG 178.0 (H) 01/07/2022   CHOLHDL 5 01/07/2022   Prediabetes: Negative for polyuria,polydipsia,and polyphagia. Lab Results  Component Value Date   HGBA1C 6.2 01/07/2022   ED:  Still taking sildenafil 20 mg daily as needed. Tolerating mediation well with no side effects. Medication is still hearing.   -He mentions he has noticed some gradual hearing loss. He has had tinnitus for some time, stable.   -Occipital scalp lesion noted about 6 months ago, scaly and pruritic. Changed shampoo and has improved. Negative for pain or bleeding.  Review of Systems  Constitutional:  Negative for activity change, appetite change, fever and unexpected weight change.  HENT:  Positive for hearing loss. Negative for facial swelling, nosebleeds, sore throat and trouble swallowing.   Eyes:  Negative for redness and visual disturbance.  Respiratory:  Negative  for cough, shortness of breath and wheezing.   Cardiovascular:  Negative for chest pain, palpitations and leg swelling.  Gastrointestinal:  Negative for abdominal pain, blood in stool, nausea and vomiting.  Endocrine: Negative for cold intolerance, heat intolerance, polydipsia, polyphagia and polyuria.  Genitourinary:  Negative for decreased urine volume, dysuria, genital sores and testicular pain.  Musculoskeletal:  Positive for  arthralgias. Negative for gait problem and myalgias.  Skin:  Negative for color change and wound.  Allergic/Immunologic: Negative for environmental allergies.  Neurological:  Negative for syncope, facial asymmetry and weakness.  Hematological:  Negative for adenopathy. Does not bruise/bleed easily.  Psychiatric/Behavioral:  Negative for confusion. The patient is not nervous/anxious.   All other systems reviewed and are negative.  Current Outpatient Medications on File Prior to Visit  Medication Sig Dispense Refill   colchicine 0.6 MG tablet Take 1 tablet (0.6 mg total) by mouth daily as needed. 30 tablet 2   Multiple Vitamin (MULTIVITAMIN) tablet Take 1 tablet by mouth daily.     sildenafil (REVATIO) 20 MG tablet TAKE 2 TABLETS BY MOUTH DAILY AS NEEDED **CHEAPEST AT [facility]** 60 tablet 0   triamcinolone cream (KENALOG) 0.1 % Apply 1 application topically 2 (two) times daily as needed. Left forearm. 30 g 1   No current facility-administered medications on file prior to visit.   Past Medical History:  Diagnosis Date   Arthritis    Hyperlipidemia    no meds   Hypertension    no meds    Past Surgical History:  Procedure Laterality Date   APPENDECTOMY     age 27   COLONOSCOPY     TONSILLECTOMY     age 43    Allergies  Allergen Reactions   Pravastatin Other (See Comments)    Joint pain    Family History  Problem Relation Age of Onset   Cancer Mother        Glyoblastoma   Cancer Father        Lung   Colon cancer Maternal Aunt    Diabetes Neg Hx    Hyperlipidemia Neg Hx    Hypertension Neg Hx    Rectal cancer Neg Hx    Stomach cancer Neg Hx     Social History   Socioeconomic History   Marital status: Divorced    Spouse name: Not on file   Number of children: Not on file   Years of education: Not on file   Highest education level: Bachelor's degree (e.g., BA, AB, BS)  Occupational History   Not on file  Tobacco Use   Smoking status: Every Day     Current packs/day: 0.50    Average packs/day: 0.5 packs/day for 53.6 years (26.8 ttl pk-yrs)    Types: Cigarettes    Start date: 04/27/1970   Smokeless tobacco: Never  Vaping Use   Vaping status: Never Used  Substance and Sexual Activity   Alcohol use: Yes    Comment: almost daily   Drug use: No   Sexual activity: Not on file  Other Topics Concern   Not on file  Social History Narrative   Not on file   Social Drivers of Health   Financial Resource Strain: Low Risk  (12/10/2023)   Overall Financial Resource Strain (CARDIA)    Difficulty of Paying Living Expenses: Not hard at all  Food Insecurity: No Food Insecurity (12/10/2023)   Hunger Vital Sign    Worried About Running Out of Food in the Last Year: Never  true    Ran Out of Food in the Last Year: Never true  Transportation Needs: No Transportation Needs (12/10/2023)   PRAPARE - Administrator, Civil Service (Medical): No    Lack of Transportation (Non-Medical): No  Physical Activity: Sufficiently Active (12/10/2023)   Exercise Vital Sign    Days of Exercise per Week: 4 days    Minutes of Exercise per Session: 50 min  Stress: No Stress Concern Present (12/10/2023)   [employer] of Occupational Health - Occupational Stress Questionnaire    Feeling of Stress : Not at all  Social Connections: Moderately Isolated (12/10/2023)   Social Connection and Isolation Panel [NHANES]    Frequency of Communication with Friends and Family: More than three times a week    Frequency of Social Gatherings with Friends and Family: More than three times a week    Attends Religious Services: Never    Database administrator or Organizations: No    Attends Engineer, structural: More than 4 times per year    Marital Status: Divorced    Today's Vitals   12/14/23 0912  BP: 128/80  Pulse: 67  Resp: 16  Temp: 98 F (36.7 C)  TempSrc: Oral  SpO2: 97%  Weight: 223 lb (101.2 kg)  Height: 5' 9.5" (1.765 m)   Body mass index is  32.46 kg/m.  Wt Readings from Last 3 Encounters:  12/14/23 223 lb (101.2 kg)  12/01/23 227 lb (103 kg)  11/18/22 231 lb 3.2 oz (104.9 kg)   Physical Exam Vitals and nursing note reviewed.  Constitutional:      General: He is not in acute distress.    Appearance: He is well-developed.  HENT:     Head: Normocephalic and atraumatic.     Right Ear: Tympanic membrane, ear canal and external ear normal.     Left Ear: Tympanic membrane, ear canal and external ear normal.     Mouth/Throat:     Mouth: Mucous membranes are moist.     Pharynx: Oropharynx is clear. Uvula midline.  Eyes:     Extraocular Movements: Extraocular movements intact.     Conjunctiva/sclera: Conjunctivae normal.     Pupils: Pupils are equal, round, and reactive to light.  Neck:     Thyroid: No thyroid mass or thyromegaly.  Cardiovascular:     Rate and Rhythm: Normal rate and regular rhythm.     Pulses:          Dorsalis pedis pulses are 2+ on the right side and 2+ on the left side.     Heart sounds: No murmur heard. Pulmonary:     Effort: Pulmonary effort is normal. No respiratory distress.     Breath sounds: Normal breath sounds.  Abdominal:     Palpations: Abdomen is soft. There is no hepatomegaly or mass.     Tenderness: There is no abdominal tenderness.  Genitourinary:    Comments: No concerns. Musculoskeletal:        General: No tenderness.     Cervical back: Normal range of motion.     Right lower leg: No edema.     Left lower leg: No edema.     Comments: No signs of synovitis.  Lymphadenopathy:     Cervical: No cervical adenopathy.     Upper Body:     Right upper body: No supraclavicular adenopathy.     Left upper body: No supraclavicular adenopathy.  Skin:    General: Skin is warm.  Findings: Rash present. No erythema.       Neurological:     General: No focal deficit present.     Mental Status: He is alert and oriented to person, place, and time.     Cranial Nerves: No cranial nerve  deficit.     Sensory: No sensory deficit.     Motor: No weakness.     Gait: Gait normal.     Deep Tendon Reflexes:     Reflex Scores:      Bicep reflexes are 2+ on the right side and 2+ on the left side.      Patellar reflexes are 2+ on the right side and 2+ on the left side. Psychiatric:        Mood and Affect: Mood and affect normal.   ASSESSMENT AND PLAN:  Mr. Mazariego was seen today for his routine general medical examination and follow up.   Lab Results  Component Value Date   CHOL 214 (H) 12/14/2023   HDL 55.50 12/14/2023   LDLCALC 123 (H) 12/14/2023   TRIG 176.0 (H) 12/14/2023   CHOLHDL 4 12/14/2023   Lab Results  Component Value Date   HGBA1C 6.1 12/14/2023   Lab Results  Component Value Date   LABURIC 7.7 12/14/2023   Lab Results  Component Value Date   NA 139 12/14/2023   CL 102 12/14/2023   K 4.3 12/14/2023   CO2 29 12/14/2023   BUN 16 12/14/2023   CREATININE 1.12 12/14/2023   GFR 67.44 12/14/2023   CALCIUM 9.6 12/14/2023   ALBUMIN 4.5 12/14/2023   GLUCOSE 108 (H) 12/14/2023   Lab Results  Component Value Date   ALT 23 12/14/2023   AST 24 12/14/2023   ALKPHOS 66 12/14/2023   BILITOT 0.9 12/14/2023   Lab Results  Component Value Date   PSA 2.67 12/14/2023   PSA 2.00 01/07/2022   PSA 2.24 09/23/2020   Routine general medical examination at a health care facility Assessment & Plan: We discussed the importance of regular physical activity and healthy diet for prevention of chronic illness and/or complications. Preventive guidelines reviewed. Vaccination: Recommend getting Shingrix at his pharmacy. Lung cancer screening up to date, due for next in 03/2024. Next CPE in a year.   Hypertension, essential, benign Assessment & Plan: BP otherwise adequately controlled. Recommend monitoring BP at home. Continue nonpharmacologic treatment. Low-salt/DASH diet recommended.  Orders: -     Basic metabolic panel; Future  Gout, arthropathy Assessment &  Plan: Stable, having about an attack for year. Continue colchicine 0.6 mg twice daily as needed. Low purine diet to continue.  Orders: -     Uric acid; Future  Erectile dysfunction, unspecified erectile dysfunction type Assessment & Plan: Problem is stable. Sildenafil 20 mg 2-3 tabs daily as needed. We discussed some side effects.  Orders: -     Sildenafil Citrate; TAKE 2 TABLETS BY MOUTH DAILY AS NEEDED **[facility]APEST AT [facility]**  Dispense: 60 tablet; Refill: 4  Prediabetes Assessment & Plan: Hemoglobin A1c 6.2 in 12/2021. Consistency with a healthy lifestyle encouraged for diabetes prevention. Further recommendation will be given according to hemoglobin A1c result.  Orders: -     Hemoglobin A1c; Future -     Basic metabolic panel; Future  Hepatic steatosis Assessment & Plan: Reported on chest CT in 03/2023. We discussed diagnosis. Recommend low-fat and low carb diet. Limit alcohol intake.  Orders: -     Hepatic function panel; Future  Aortic atherosclerosis (HCC) Assessment &  Plan: Seen on chest CT done in 03/2023. Not on statin med or aspirin. We discussed CV implications and benefits of statin meds.  Orders: -     Lipid panel; Future  BPH associated with nocturia Assessment & Plan: Not on pharmacologic treatment. Stable. PSA ordered today.  Orders: -     PSA; Future -     Urinalysis, Routine w reflex microscopic; Future  Tobacco use disorder Assessment & Plan: Interested in smoking cessation. He tried chantix a few years ago and would like to try again. Lung cancer screening due in 03/2024.  Orders: -     Varenicline Tartrate; Take one 0.5 mg tablet by mouth once daily for 3 days, then increase to one 0.5 mg tablet twice daily for 4 days, then increase to one 1 mg tablet twice daily.  Dispense: 53 tablet; Refill: 0  Seborrheic dermatitis of scalp Assessment & Plan: We discussed Dx's,prognosis,and treatment options. Recommend topical Lotrisone  on affected area, small amount bid prn. OTC Nizoral shampoo. F/U as needed.  Orders: -     Clotrimazole-Betamethasone; Apply 1 Application topically 2 (two) times daily as needed.  Dispense: 30 g; Refill: 1  Sensorineural hearing loss (SNHL) of both ears Assessment & Plan: R>L. Recommend audiologist consultation for hearing aid fitting.  Hearing Screening   500Hz  1000Hz  2000Hz  4000Hz   Right ear Fail Fail Pass Fail  Left ear Pass Pass Pass Fail     Hyperlipidemia, unspecified hyperlipidemia type Assessment & Plan: Non pharmacologic treatment recommended for now. Further recommendations will be given according to 10 years CVD risk score and lipid panel numbers.   Return in 1 year (on 12/13/2024) for CPE, chronic problems, Labs.  I, Rolla Etienne Wierda, acting as a scribe for Yoandri Congrove [place], MD., have documented all relevant documentation on the behalf of Abie Cheek [place], MD, as directed by  Maliaka Brasington [place], MD while in the presence of Channing Savich [place], MD.   I, Carletta Feasel [place], MD, have reviewed all documentation for this visit. The documentation on 12/14/23 for the exam, diagnosis, procedures, and orders are all accurate and complete.  Eashan Schipani G. [place], MD  [facility]. [place] office.

## 2023-12-14 NOTE — Assessment & Plan Note (Addendum)
We discussed Dx's,prognosis,and treatment options. Recommend topical Lotrisone on affected area, small amount bid prn. OTC Nizoral shampoo. F/U as needed.

## 2023-12-14 NOTE — Assessment & Plan Note (Addendum)
Reported on chest CT in 03/2023. We discussed diagnosis. Recommend low-fat and low carb diet. Limit alcohol intake.

## 2023-12-14 NOTE — Assessment & Plan Note (Addendum)
We discussed the importance of regular physical activity and healthy diet for prevention of chronic illness and/or complications. Preventive guidelines reviewed. Vaccination: Recommend getting Shingrix at his pharmacy. Lung cancer screening up to date, due for next in 03/2024. Next CPE in a year.

## 2023-12-14 NOTE — Assessment & Plan Note (Signed)
Not on pharmacologic treatment. Stable. PSA ordered today.

## 2023-12-14 NOTE — Assessment & Plan Note (Signed)
BP otherwise adequately controlled. Recommend monitoring BP at home. Continue nonpharmacologic treatment. Low-salt/DASH diet recommended.

## 2023-12-14 NOTE — Assessment & Plan Note (Addendum)
Problem is stable. Sildenafil 20 mg 2-3 tabs daily as needed. We discussed some side effects.

## 2023-12-14 NOTE — Assessment & Plan Note (Signed)
Non pharmacologic treatment recommended for now. Further recommendations will be given according to 10 years CVD risk score and lipid panel numbers.

## 2023-12-14 NOTE — Assessment & Plan Note (Signed)
Hemoglobin A1c 6.2 in 12/2021. Consistency with a healthy lifestyle encouraged for diabetes prevention. Further recommendation will be given according to hemoglobin A1c result.

## 2023-12-14 NOTE — Patient Instructions (Addendum)
A few things to remember from today's visit:  Routine general medical examination at a health care facility  Hypertension, essential, benign - Plan: Basic metabolic panel  Gout, arthropathy - Plan: Uric acid  Erectile dysfunction, unspecified erectile dysfunction type  Prediabetes - Plan: Hemoglobin A1c, Basic metabolic panel  Hepatic steatosis - Plan: Hepatic function panel  Aortic atherosclerosis (HCC) - Plan: Lipid panel  BPH associated with nocturia - Plan: PSA, Urinalysis, Routine w reflex microscopic  Tobacco use disorder - Plan: varenicline (CHANTIX) 1 MG tablet  Seborrheic dermatitis of scalp - Plan: clotrimazole-betamethasone (LOTRISONE) cream  Monitor blood pressure 1-2 per month.  Cream on scalp affected area 2 times daily as needed. Over the counter Nizoral shampoo.  If you need refills for medications you take chronically, please call your pharmacy. Do not use My Chart to request refills or for acute issues that need immediate attention. If you send a my chart message, it may take a few days to be addressed, specially if I am not in the office.  Please be sure medication list is accurate. If a new problem present, please set up appointment sooner than planned today.

## 2023-12-14 NOTE — Assessment & Plan Note (Signed)
Seen on chest CT done in 03/2023. Not on statin med or aspirin. We discussed CV implications and benefits of statin meds.

## 2024-02-23 ENCOUNTER — Other Ambulatory Visit: Payer: Self-pay | Admitting: Medical Genetics

## 2024-02-24 ENCOUNTER — Encounter: Payer: Self-pay | Admitting: Family Medicine

## 2024-02-25 ENCOUNTER — Other Ambulatory Visit (HOSPITAL_COMMUNITY)
Admission: RE | Admit: 2024-02-25 | Discharge: 2024-02-25 | Disposition: A | Discharge status: Home / Self Care | Payer: Self-pay | Source: Ambulatory Visit | Attending: Oncology | Admitting: Oncology

## 2024-03-07 LAB — GENECONNECT MOLECULAR SCREEN: Genetic Analysis Overall Interpretation: NEGATIVE

## 2024-03-10 NOTE — Telephone Encounter (Signed)
Can you help him with this?

## 2024-03-23 ENCOUNTER — Ambulatory Visit
Admission: RE | Admit: 2024-03-23 | Discharge: 2024-03-23 | Disposition: A | Discharge status: Home / Self Care | Source: Ambulatory Visit | Attending: Acute Care | Admitting: Acute Care

## 2024-03-23 DIAGNOSIS — F17210 Nicotine dependence, cigarettes, uncomplicated: Secondary | ICD-10-CM | POA: Diagnosis not present

## 2024-03-23 DIAGNOSIS — Z122 Encounter for screening for malignant neoplasm of respiratory organs: Secondary | ICD-10-CM | POA: Diagnosis not present

## 2024-03-23 DIAGNOSIS — Z87891 Personal history of nicotine dependence: Secondary | ICD-10-CM

## 2024-04-17 ENCOUNTER — Other Ambulatory Visit: Payer: Self-pay

## 2024-04-17 DIAGNOSIS — Z87891 Personal history of nicotine dependence: Secondary | ICD-10-CM

## 2024-04-17 DIAGNOSIS — Z122 Encounter for screening for malignant neoplasm of respiratory organs: Secondary | ICD-10-CM

## 2024-04-17 DIAGNOSIS — F17210 Nicotine dependence, cigarettes, uncomplicated: Secondary | ICD-10-CM

## 2024-09-26 ENCOUNTER — Encounter: Payer: Self-pay | Admitting: Family Medicine

## 2024-10-09 ENCOUNTER — Other Ambulatory Visit: Payer: Self-pay | Admitting: Family Medicine

## 2024-10-09 DIAGNOSIS — E785 Hyperlipidemia, unspecified: Secondary | ICD-10-CM

## 2024-10-09 MED ORDER — EZETIMIBE 10 MG PO TABS: 10.0000 mg | ORAL_TABLET | Freq: Every day | ORAL | 1 refills | Status: AC

## 2024-11-13 ENCOUNTER — Encounter: Payer: Self-pay | Admitting: Family Medicine

## 2024-12-06 ENCOUNTER — Ambulatory Visit: Payer: Medicare HMO

## 2024-12-06 VITALS — BP 124/62 | HR 85 | Temp 98.0°F | Ht 69.5 in | Wt 227.9 lb

## 2024-12-06 DIAGNOSIS — Z0000 Encounter for general adult medical examination without abnormal findings: Secondary | ICD-10-CM

## 2024-12-06 NOTE — Progress Notes (Cosign Needed)
 "  Chief Complaint  Patient presents with   Medicare Wellness     Subjective:   [name] is a 69 y.o. male who presents for a Medicare Annual Wellness Visit.  Visit info / Clinical Intake: Medicare Wellness Visit Type:: Subsequent Annual Wellness Visit Persons participating in visit and providing information:: patient Medicare Wellness Visit Mode:: In-person (required for WTM) Interpreter Needed?: No Pre-visit prep was completed: yes AWV questionnaire completed by patient prior to visit?: yes Date:: 12/06/24 Living arrangements:: with family/others Patient's Overall Health Status Rating: good Typical amount of pain: some Does pain affect daily life?: no Are you currently prescribed opioids?: no  Dietary Habits and Nutritional Risks How many meals a day?: 3 Eats fruit and vegetables daily?: yes Most meals are obtained by: preparing own meals In the last 2 weeks, have you had any of the following?: none Diabetic:: no  Functional Status Activities of Daily Living (to include ambulation/medication): Independent Ambulation: Independent with device- listed below Home Assistive Devices/Equipment: Eyeglasses; Other (Comment) (Hearing Aids) Medication Administration: Independent Home Management (perform basic housework or laundry): Independent Manage your own finances?: yes Primary transportation is: driving Concerns about vision?: no *vision screening is required for WTM* Concerns about hearing?: no  Fall Screening Falls in the past year?: 0 Number of falls in past year: 0 Was there an injury with Fall?: 0 Fall Risk Category Calculator: 0 Patient Fall Risk Level: Low Fall Risk  Fall Risk Patient at Risk for Falls Due to: No Fall Risks Fall risk Follow up: Falls evaluation completed  Home and Transportation Safety: All rugs have non-skid backing?: yes All stairs or steps have railings?: yes Grab bars in the bathtub or shower?: (!) no Have non-skid surface in  bathtub or shower?: (!) no Good home lighting?: yes Regular seat belt use?: yes Hospital stays in the last year:: no  Cognitive Assessment Difficulty concentrating, remembering, or making decisions? : no Will 6CIT or Mini Cog be Completed: yes What year is it?: 0 points What month is it?: 0 points Give patient an address phrase to remember (5 components): [address] [place]  About what time is it?: 0 points Count backwards from 20 to 1: 0 points Say the months of the year in reverse: 0 points Repeat the address phrase from earlier: 0 points 6 CIT Score: 0 points  Advance Directives (For Healthcare) Does Patient Have a Medical Advance Directive?: No Would patient like information on creating a medical advance directive?: No - Patient declined  Reviewed/Updated  Reviewed/Updated: Reviewed All (Medical, Surgical, Family, Medications, Allergies, Care Teams, Patient Goals)    Allergies (verified) Pravastatin    Current Medications (verified) Outpatient Encounter Medications as of 12/06/2024  Medication Sig   clotrimazole -betamethasone  (LOTRISONE ) cream Apply 1 Application topically 2 (two) times daily as needed.   colchicine  0.6 MG tablet Take 1 tablet (0.6 mg total) by mouth daily as needed.   ezetimibe  (ZETIA ) 10 MG tablet Take 1 tablet (10 mg total) by mouth daily.   Multiple Vitamin (MULTIVITAMIN) tablet Take 1 tablet by mouth daily.   sildenafil  (REVATIO ) 20 MG tablet TAKE 2 TABLETS BY MOUTH DAILY AS NEEDED **CHEAPEST AT [facility]**   No facility-administered encounter medications on file as of 12/06/2024.    History: Past Medical History:  Diagnosis Date   Arthritis    Hyperlipidemia    no meds   Hypertension    no meds   Past Surgical History:  Procedure Laterality Date   APPENDECTOMY  age 67   COLONOSCOPY     TONSILLECTOMY     age 89   Family History  Problem Relation Age of Onset   Cancer Mother        Glyoblastoma   Cancer Father         Lung   Colon cancer Maternal Aunt    Diabetes Neg Hx    Hyperlipidemia Neg Hx    Hypertension Neg Hx    Rectal cancer Neg Hx    Stomach cancer Neg Hx    Social History   Occupational History   Not on file  Tobacco Use   Smoking status: Every Day    Current packs/day: 0.50    Average packs/day: 0.5 packs/day for 54.6 years (27.3 ttl pk-yrs)    Types: Cigarettes    Start date: 04/27/1970   Smokeless tobacco: Never  Vaping Use   Vaping status: Never Used  Substance and Sexual Activity   Alcohol use: Yes    Comment: almost daily   Drug use: No   Sexual activity: Not on file   Tobacco Counseling Ready to quit: No Counseling given: Yes  SDOH Screenings   Food Insecurity: No Food Insecurity (12/06/2024)  Housing: Unknown (12/06/2024)  Transportation Needs: No Transportation Needs (12/06/2024)  Utilities: Not At Risk (12/06/2024)  Alcohol Screen: Low Risk (12/06/2024)  Depression (PHQ2-9): Low Risk (12/06/2024)  Financial Resource Strain: Low Risk (12/06/2024)  Physical Activity: Sufficiently Active (12/06/2024)  Social Connections: Socially Isolated (12/06/2024)  Stress: No Stress Concern Present (12/06/2024)  Tobacco Use: High Risk (12/06/2024)  Health Literacy: Adequate Health Literacy (12/06/2024)   See flowsheets for full screening details  Depression Screen PHQ 2 & 9 Depression Scale- Over the past 2 weeks, how often have you been bothered by any of the following problems? Little interest or pleasure in doing things: 0 Feeling down, depressed, or hopeless (PHQ Adolescent also includes...irritable): 0 PHQ-2 Total Score: 0     Goals Addressed               This Visit's Progress     Stay Active (pt-stated)        Lose weight!             Objective:    Today's Vitals   12/06/24 1500  BP: 124/62  Pulse: 85  Temp: 98 F (36.7 C)  TempSrc: Oral  SpO2: 95%  Weight: 227 lb 14.4 oz (103.4 kg)  Height: 5' 9.5 (1.765 m)   Body mass index is 33.17  kg/m.  Hearing/Vision screen Hearing Screening - Comments:: Wears Hearing Aids Vision Screening - Comments:: Wears rx glasses - up to date with routine eye exams with  [facility] Immunizations and Health Maintenance Health Maintenance  Topic Date Due   Zoster Vaccines- Shingrix (1 of 2) Never done   DTaP/Tdap/Td (2 - Td or Tdap) 06/16/2021   COVID-19 Vaccine (4 - 2025-26 season) 07/03/2024   Lung Cancer Screening  03/23/2025   Medicare Annual Wellness (AWV)  12/06/2025   Colonoscopy  03/06/2027   Pneumococcal Vaccine: 50+ Years  Completed   Influenza Vaccine  Completed   Hepatitis C Screening  Completed   Meningococcal B Vaccine  Aged Out        Assessment/Plan:  This is a routine wellness examination for Patrick Cannon.  Patient Care Team: Jordan, Betty G, MD as PCP - General (Family Medicine) Liam Lerner, MD as Consulting Physician (Orthopedic Surgery)  I have personally reviewed and noted the following in the patients  chart:   Medical and social history Use of alcohol, tobacco or illicit drugs  Current medications and supplements including opioid prescriptions. Functional ability and status Nutritional status Physical activity Advanced directives List of other physicians Hospitalizations, surgeries, and ER visits in previous 12 months Vitals Screenings to include cognitive, depression, and falls Referrals and appointments  No orders of the defined types were placed in this encounter.  In addition, I have reviewed and discussed with patient certain preventive protocols, quality metrics, and best practice recommendations. A written personalized care plan for preventive services as well as general preventive health recommendations were provided to patient.   Rojelio LELON Blush, LPN   05/06/7972   Return in 53 weeks (on 12/12/2025).  After Visit Summary: (In Person-Declined) Patient declined AVS at this time.  Nurse Notes: No voiced or noted concerns at this time "

## 2024-12-06 NOTE — Patient Instructions (Addendum)
 Patrick Cannon,  Thank you for taking the time for your Medicare Wellness Visit. I appreciate your continued commitment to your health goals. Please review the care plan we discussed, and feel free to reach out if I can assist you further.  Please note that Annual Wellness Visits do not include a physical exam. Some assessments may be limited, especially if the visit was conducted virtually. If needed, we may recommend an in-person follow-up with your provider.  Ongoing Care Seeing your primary care provider every 3 to 6 months helps us  monitor your health and provide consistent, personalized care.   Referrals If a referral was made during today's visit and you haven't received any updates within two weeks, please contact the referred provider directly to check on the status.  Recommended Screenings:  Health Maintenance  Topic Date Due   Zoster (Shingles) Vaccine (1 of 2) Never done   DTaP/Tdap/Td vaccine (2 - Td or Tdap) 06/16/2021   COVID-19 Vaccine (4 - 2025-26 season) 07/03/2024   Screening for Lung Cancer  03/23/2025   Medicare Annual Wellness Visit  12/06/2025   Colon Cancer Screening  03/06/2027   Pneumococcal Vaccine for age over 43  Completed   Flu Shot  Completed   Hepatitis C Screening  Completed   Meningitis B Vaccine  Aged Out       12/06/2024    3:08 PM  Advanced Directives  Does Patient Have a Medical Advance Directive? No  Would patient like information on creating a medical advance directive? No - Patient declined    Vision: Annual vision screenings are recommended for early detection of glaucoma, cataracts, and diabetic retinopathy. These exams can also reveal signs of chronic conditions such as diabetes and high blood pressure.  Dental: Annual dental screenings help detect early signs of oral cancer, gum disease, and other conditions linked to overall health, including heart disease and diabetes.  Please see the attached documents for additional preventive care  recommendations.

## 2024-12-15 ENCOUNTER — Encounter: Admitting: Family Medicine

## 2025-12-12 ENCOUNTER — Ambulatory Visit
